# Patient Record
Sex: Female | Born: 1965
Health system: Southern US, Community
[De-identification: ages and names within clinical notes are randomized; demographics above are authoritative.]

## PROBLEM LIST (undated history)

## (undated) DIAGNOSIS — T4145XA Adverse effect of unspecified anesthetic, initial encounter: Secondary | ICD-10-CM

## (undated) DIAGNOSIS — L719 Rosacea, unspecified: Secondary | ICD-10-CM

## (undated) DIAGNOSIS — C801 Malignant (primary) neoplasm, unspecified: Secondary | ICD-10-CM

## (undated) DIAGNOSIS — T8859XA Other complications of anesthesia, initial encounter: Secondary | ICD-10-CM

## (undated) DIAGNOSIS — R232 Flushing: Secondary | ICD-10-CM

## (undated) DIAGNOSIS — T451X5A Adverse effect of antineoplastic and immunosuppressive drugs, initial encounter: Secondary | ICD-10-CM

## (undated) DIAGNOSIS — J329 Chronic sinusitis, unspecified: Secondary | ICD-10-CM

## (undated) DIAGNOSIS — H04123 Dry eye syndrome of bilateral lacrimal glands: Secondary | ICD-10-CM

## (undated) DIAGNOSIS — R51 Headache: Secondary | ICD-10-CM

## (undated) DIAGNOSIS — E039 Hypothyroidism, unspecified: Secondary | ICD-10-CM

## (undated) DIAGNOSIS — Z789 Other specified health status: Secondary | ICD-10-CM

## (undated) DIAGNOSIS — F32A Depression, unspecified: Secondary | ICD-10-CM

## (undated) DIAGNOSIS — F329 Major depressive disorder, single episode, unspecified: Secondary | ICD-10-CM

## (undated) HISTORY — DX: Major depressive disorder, single episode, unspecified: F32.9

## (undated) HISTORY — PX: HAND SURGERY: SHX662

## (undated) HISTORY — PX: BREAST SURGERY: SHX581

## (undated) HISTORY — PX: LASER ABLATION: SHX1947

## (undated) HISTORY — DX: Dry eye syndrome of bilateral lacrimal glands: H04.123

## (undated) HISTORY — DX: Flushing: R23.2

## (undated) HISTORY — PX: OTHER SURGICAL HISTORY: SHX169

## (undated) HISTORY — DX: Rosacea, unspecified: L71.9

## (undated) HISTORY — PX: TONSILLECTOMY: SUR1361

## (undated) HISTORY — PX: FUNCTIONAL ENDOSCOPIC SINUS SURGERY: SUR616

## (undated) HISTORY — DX: Adverse effect of antineoplastic and immunosuppressive drugs, initial encounter: T45.1X5A

## (undated) HISTORY — DX: Depression, unspecified: F32.A

---

## 2001-08-21 ENCOUNTER — Encounter: Admission: RE | Admit: 2001-08-21 | Discharge: 2001-08-21 | Payer: Self-pay | Admitting: Internal Medicine

## 2002-08-02 ENCOUNTER — Encounter: Admission: RE | Admit: 2002-08-02 | Discharge: 2002-08-02 | Payer: Self-pay | Admitting: Internal Medicine

## 2002-12-16 ENCOUNTER — Encounter: Admission: RE | Admit: 2002-12-16 | Discharge: 2002-12-16 | Payer: Self-pay | Admitting: Internal Medicine

## 2003-02-01 ENCOUNTER — Encounter: Admission: RE | Admit: 2003-02-01 | Discharge: 2003-02-01 | Payer: Self-pay | Admitting: Internal Medicine

## 2003-04-05 ENCOUNTER — Encounter: Admission: RE | Admit: 2003-04-05 | Discharge: 2003-04-05 | Payer: Self-pay | Admitting: Internal Medicine

## 2003-12-27 ENCOUNTER — Encounter: Admission: RE | Admit: 2003-12-27 | Discharge: 2003-12-27 | Payer: Self-pay | Admitting: Internal Medicine

## 2004-08-16 ENCOUNTER — Ambulatory Visit: Payer: Self-pay | Admitting: Internal Medicine

## 2004-10-17 ENCOUNTER — Ambulatory Visit (HOSPITAL_COMMUNITY): Admission: RE | Admit: 2004-10-17 | Discharge: 2004-10-17 | Payer: Self-pay | Admitting: Allergy

## 2004-11-29 ENCOUNTER — Ambulatory Visit: Payer: Self-pay | Admitting: Internal Medicine

## 2005-02-21 ENCOUNTER — Ambulatory Visit: Payer: Self-pay | Admitting: Internal Medicine

## 2005-02-21 ENCOUNTER — Ambulatory Visit (HOSPITAL_COMMUNITY): Admission: RE | Admit: 2005-02-21 | Discharge: 2005-02-21 | Payer: Self-pay | Admitting: Internal Medicine

## 2005-02-27 ENCOUNTER — Ambulatory Visit (HOSPITAL_COMMUNITY): Admission: RE | Admit: 2005-02-27 | Discharge: 2005-02-27 | Payer: Self-pay | Admitting: Internal Medicine

## 2005-03-15 ENCOUNTER — Ambulatory Visit: Payer: Self-pay

## 2005-03-22 ENCOUNTER — Ambulatory Visit (HOSPITAL_COMMUNITY): Admission: RE | Admit: 2005-03-22 | Discharge: 2005-03-22 | Payer: Self-pay | Admitting: Internal Medicine

## 2005-04-15 ENCOUNTER — Ambulatory Visit: Payer: Self-pay | Admitting: Pulmonary Disease

## 2005-05-01 ENCOUNTER — Ambulatory Visit: Payer: Self-pay | Admitting: Pulmonary Disease

## 2005-05-02 ENCOUNTER — Ambulatory Visit: Payer: Self-pay | Admitting: Internal Medicine

## 2005-05-10 ENCOUNTER — Other Ambulatory Visit: Admission: RE | Admit: 2005-05-10 | Discharge: 2005-05-10 | Payer: Self-pay | Admitting: Obstetrics and Gynecology

## 2005-06-12 ENCOUNTER — Encounter: Admission: RE | Admit: 2005-06-12 | Discharge: 2005-06-12 | Payer: Self-pay | Admitting: Otolaryngology

## 2005-10-07 ENCOUNTER — Encounter (INDEPENDENT_AMBULATORY_CARE_PROVIDER_SITE_OTHER): Payer: Self-pay | Admitting: *Deleted

## 2005-10-07 ENCOUNTER — Ambulatory Visit (HOSPITAL_BASED_OUTPATIENT_CLINIC_OR_DEPARTMENT_OTHER): Admission: RE | Admit: 2005-10-07 | Discharge: 2005-10-07 | Payer: Self-pay | Admitting: Otolaryngology

## 2005-11-26 ENCOUNTER — Ambulatory Visit: Payer: Self-pay | Admitting: Internal Medicine

## 2006-03-13 ENCOUNTER — Observation Stay (HOSPITAL_COMMUNITY): Admission: EM | Admit: 2006-03-13 | Discharge: 2006-03-14 | Payer: Self-pay | Admitting: Emergency Medicine

## 2006-10-24 ENCOUNTER — Ambulatory Visit: Payer: Self-pay | Admitting: Internal Medicine

## 2006-10-24 ENCOUNTER — Encounter: Payer: Self-pay | Admitting: Infectious Diseases

## 2006-10-24 ENCOUNTER — Ambulatory Visit (HOSPITAL_COMMUNITY): Admission: RE | Admit: 2006-10-24 | Discharge: 2006-10-25 | Payer: Self-pay | Admitting: Orthopaedic Surgery

## 2006-10-24 DIAGNOSIS — L03019 Cellulitis of unspecified finger: Secondary | ICD-10-CM

## 2006-10-24 DIAGNOSIS — L02519 Cutaneous abscess of unspecified hand: Secondary | ICD-10-CM | POA: Insufficient documentation

## 2007-04-01 ENCOUNTER — Encounter (INDEPENDENT_AMBULATORY_CARE_PROVIDER_SITE_OTHER): Payer: Self-pay | Admitting: Obstetrics and Gynecology

## 2007-04-01 ENCOUNTER — Ambulatory Visit (HOSPITAL_COMMUNITY): Admission: RE | Admit: 2007-04-01 | Discharge: 2007-04-01 | Payer: Self-pay | Admitting: Obstetrics and Gynecology

## 2008-05-13 ENCOUNTER — Encounter (INDEPENDENT_AMBULATORY_CARE_PROVIDER_SITE_OTHER): Payer: Self-pay | Admitting: Internal Medicine

## 2008-05-13 ENCOUNTER — Ambulatory Visit (HOSPITAL_COMMUNITY): Admission: RE | Admit: 2008-05-13 | Discharge: 2008-05-13 | Payer: Self-pay | Admitting: Internal Medicine

## 2008-05-13 DIAGNOSIS — M79609 Pain in unspecified limb: Secondary | ICD-10-CM | POA: Insufficient documentation

## 2008-08-19 HISTORY — PX: BREAST LUMPECTOMY: SHX2

## 2008-08-19 HISTORY — PX: BREAST BIOPSY: SHX20

## 2008-10-17 ENCOUNTER — Encounter: Admission: RE | Admit: 2008-10-17 | Discharge: 2008-10-17 | Payer: Self-pay | Admitting: Obstetrics and Gynecology

## 2008-10-17 ENCOUNTER — Encounter (INDEPENDENT_AMBULATORY_CARE_PROVIDER_SITE_OTHER): Payer: Self-pay | Admitting: Obstetrics and Gynecology

## 2008-10-17 ENCOUNTER — Encounter (INDEPENDENT_AMBULATORY_CARE_PROVIDER_SITE_OTHER): Payer: Self-pay | Admitting: Diagnostic Radiology

## 2008-10-19 ENCOUNTER — Ambulatory Visit: Payer: Self-pay | Admitting: Oncology

## 2008-10-24 ENCOUNTER — Encounter: Admission: RE | Admit: 2008-10-24 | Discharge: 2008-10-24 | Payer: Self-pay | Admitting: Surgery

## 2008-10-24 ENCOUNTER — Encounter (INDEPENDENT_AMBULATORY_CARE_PROVIDER_SITE_OTHER): Payer: Self-pay | Admitting: Surgery

## 2008-10-24 ENCOUNTER — Encounter: Admission: RE | Admit: 2008-10-24 | Discharge: 2008-10-24 | Payer: Self-pay | Admitting: Obstetrics and Gynecology

## 2008-10-25 ENCOUNTER — Ambulatory Visit: Payer: Self-pay | Admitting: Genetic Counselor

## 2008-11-02 ENCOUNTER — Ambulatory Visit: Admission: RE | Admit: 2008-11-02 | Discharge: 2008-12-16 | Payer: Self-pay | Admitting: Radiation Oncology

## 2008-11-08 ENCOUNTER — Ambulatory Visit (HOSPITAL_COMMUNITY): Admission: RE | Admit: 2008-11-08 | Discharge: 2008-11-08 | Payer: Self-pay | Admitting: Surgery

## 2008-11-09 ENCOUNTER — Encounter: Admission: RE | Admit: 2008-11-09 | Discharge: 2008-11-09 | Payer: Self-pay | Admitting: Surgery

## 2008-11-09 ENCOUNTER — Ambulatory Visit (HOSPITAL_BASED_OUTPATIENT_CLINIC_OR_DEPARTMENT_OTHER): Admission: RE | Admit: 2008-11-09 | Discharge: 2008-11-09 | Payer: Self-pay | Admitting: Surgery

## 2008-11-09 ENCOUNTER — Encounter (INDEPENDENT_AMBULATORY_CARE_PROVIDER_SITE_OTHER): Payer: Self-pay | Admitting: Surgery

## 2008-12-14 ENCOUNTER — Ambulatory Visit: Payer: Self-pay | Admitting: Psychiatry

## 2008-12-19 ENCOUNTER — Ambulatory Visit: Payer: Self-pay | Admitting: Psychiatry

## 2008-12-19 ENCOUNTER — Ambulatory Visit: Admission: RE | Admit: 2008-12-19 | Discharge: 2009-02-12 | Payer: Self-pay | Admitting: Radiation Oncology

## 2008-12-28 ENCOUNTER — Ambulatory Visit: Payer: Self-pay | Admitting: Psychiatry

## 2009-01-10 ENCOUNTER — Ambulatory Visit: Payer: Self-pay | Admitting: Oncology

## 2009-01-18 ENCOUNTER — Ambulatory Visit: Payer: Self-pay | Admitting: Psychiatry

## 2009-03-27 ENCOUNTER — Telehealth: Payer: Self-pay | Admitting: Internal Medicine

## 2009-04-07 DIAGNOSIS — F3289 Other specified depressive episodes: Secondary | ICD-10-CM | POA: Insufficient documentation

## 2009-04-07 DIAGNOSIS — J309 Allergic rhinitis, unspecified: Secondary | ICD-10-CM | POA: Insufficient documentation

## 2009-04-07 DIAGNOSIS — F329 Major depressive disorder, single episode, unspecified: Secondary | ICD-10-CM | POA: Insufficient documentation

## 2009-04-07 DIAGNOSIS — K5900 Constipation, unspecified: Secondary | ICD-10-CM | POA: Insufficient documentation

## 2009-04-07 DIAGNOSIS — K649 Unspecified hemorrhoids: Secondary | ICD-10-CM | POA: Insufficient documentation

## 2009-04-07 DIAGNOSIS — K581 Irritable bowel syndrome with constipation: Secondary | ICD-10-CM | POA: Insufficient documentation

## 2009-04-13 ENCOUNTER — Ambulatory Visit: Payer: Self-pay | Admitting: Internal Medicine

## 2009-04-13 DIAGNOSIS — Z853 Personal history of malignant neoplasm of breast: Secondary | ICD-10-CM | POA: Insufficient documentation

## 2009-04-13 DIAGNOSIS — J45990 Exercise induced bronchospasm: Secondary | ICD-10-CM | POA: Insufficient documentation

## 2009-04-13 DIAGNOSIS — D649 Anemia, unspecified: Secondary | ICD-10-CM | POA: Insufficient documentation

## 2009-04-13 DIAGNOSIS — E039 Hypothyroidism, unspecified: Secondary | ICD-10-CM | POA: Insufficient documentation

## 2009-04-14 ENCOUNTER — Encounter: Admission: RE | Admit: 2009-04-14 | Discharge: 2009-04-14 | Payer: Self-pay | Admitting: Radiation Oncology

## 2009-07-06 ENCOUNTER — Ambulatory Visit: Payer: Self-pay | Admitting: Oncology

## 2009-07-11 LAB — CBC WITH DIFFERENTIAL/PLATELET
BASO%: 0.5 % (ref 0.0–2.0)
Basophils Absolute: 0 10*3/uL (ref 0.0–0.1)
EOS%: 1.5 % (ref 0.0–7.0)
Eosinophils Absolute: 0.1 10*3/uL (ref 0.0–0.5)
HCT: 38.7 % (ref 34.8–46.6)
HGB: 13.2 g/dL (ref 11.6–15.9)
LYMPH%: 22.8 % (ref 14.0–49.7)
MCH: 33.1 pg (ref 25.1–34.0)
MCHC: 34 g/dL (ref 31.5–36.0)
MCV: 97.3 fL (ref 79.5–101.0)
MONO#: 0.6 10*3/uL (ref 0.1–0.9)
MONO%: 8.5 % (ref 0.0–14.0)
NEUT#: 4.5 10*3/uL (ref 1.5–6.5)
NEUT%: 66.7 % (ref 38.4–76.8)
Platelets: 222 10*3/uL (ref 145–400)
RBC: 3.98 10*6/uL (ref 3.70–5.45)
RDW: 13.1 % (ref 11.2–14.5)
WBC: 6.8 10*3/uL (ref 3.9–10.3)
lymph#: 1.5 10*3/uL (ref 0.9–3.3)

## 2009-07-11 LAB — COMPREHENSIVE METABOLIC PANEL
ALT: 13 U/L (ref 0–35)
AST: 17 U/L (ref 0–37)
Albumin: 4.2 g/dL (ref 3.5–5.2)
Alkaline Phosphatase: 22 U/L — ABNORMAL LOW (ref 39–117)
BUN: 16 mg/dL (ref 6–23)
CO2: 23 mEq/L (ref 19–32)
Calcium: 9.2 mg/dL (ref 8.4–10.5)
Chloride: 107 mEq/L (ref 96–112)
Creatinine, Ser: 1.06 mg/dL (ref 0.40–1.20)
Glucose, Bld: 100 mg/dL — ABNORMAL HIGH (ref 70–99)
Potassium: 4.2 mEq/L (ref 3.5–5.3)
Sodium: 142 mEq/L (ref 135–145)
Total Bilirubin: 0.4 mg/dL (ref 0.3–1.2)
Total Protein: 6.7 g/dL (ref 6.0–8.3)

## 2009-07-11 LAB — LACTATE DEHYDROGENASE: LDH: 145 U/L (ref 94–250)

## 2009-10-19 ENCOUNTER — Encounter: Admission: RE | Admit: 2009-10-19 | Discharge: 2009-10-19 | Payer: Self-pay | Admitting: Oncology

## 2010-07-05 ENCOUNTER — Ambulatory Visit: Payer: Self-pay | Admitting: Oncology

## 2010-07-16 LAB — COMPREHENSIVE METABOLIC PANEL
CO2: 26 mEq/L (ref 19–32)
Calcium: 9.1 mg/dL (ref 8.4–10.5)
Chloride: 108 mEq/L (ref 96–112)
Creatinine, Ser: 1.26 mg/dL — ABNORMAL HIGH (ref 0.40–1.20)
Potassium: 3.9 mEq/L (ref 3.5–5.3)
Total Bilirubin: 0.4 mg/dL (ref 0.3–1.2)
Total Protein: 6.7 g/dL (ref 6.0–8.3)

## 2010-07-16 LAB — CBC WITH DIFFERENTIAL/PLATELET
EOS%: 1.2 % (ref 0.0–7.0)
MONO#: 0.5 10*3/uL (ref 0.1–0.9)
MONO%: 6.7 % (ref 0.0–14.0)
NEUT#: 5.4 10*3/uL (ref 1.5–6.5)
RBC: 3.72 10*6/uL (ref 3.70–5.45)
RDW: 12.6 % (ref 11.2–14.5)
WBC: 7.7 10*3/uL (ref 3.9–10.3)

## 2010-09-08 ENCOUNTER — Other Ambulatory Visit: Payer: Self-pay | Admitting: Oncology

## 2010-09-08 DIAGNOSIS — Z9889 Other specified postprocedural states: Secondary | ICD-10-CM

## 2010-09-26 ENCOUNTER — Other Ambulatory Visit: Payer: Self-pay | Admitting: Internal Medicine

## 2010-09-26 ENCOUNTER — Telehealth: Payer: Self-pay | Admitting: Internal Medicine

## 2010-09-26 DIAGNOSIS — R1013 Epigastric pain: Secondary | ICD-10-CM | POA: Insufficient documentation

## 2010-09-28 ENCOUNTER — Ambulatory Visit (HOSPITAL_COMMUNITY)
Admission: RE | Admit: 2010-09-28 | Discharge: 2010-09-28 | Disposition: A | Discharge status: Home / Self Care | Payer: Commercial Managed Care - PPO | Source: Ambulatory Visit | Attending: Internal Medicine | Admitting: Internal Medicine

## 2010-09-28 DIAGNOSIS — K219 Gastro-esophageal reflux disease without esophagitis: Secondary | ICD-10-CM | POA: Insufficient documentation

## 2010-09-28 DIAGNOSIS — R109 Unspecified abdominal pain: Secondary | ICD-10-CM | POA: Insufficient documentation

## 2010-09-28 DIAGNOSIS — Z853 Personal history of malignant neoplasm of breast: Secondary | ICD-10-CM | POA: Insufficient documentation

## 2010-10-04 NOTE — Progress Notes (Signed)
Summary: Triage  Phone Note Call from Patient Call back at Home Phone [phone]   Caller: Patient Call For: Dr. Juanda Chance Reason for Call: Talk to Nurse Summary of Call: Pt is calling because she thinks she is having a gallbladder attack, spoke with brodie at the hospital and would like to proceed with UAS Initial call taken by: [place] Johnson,  September 26, 2010 9:42 AM  Follow-up for Phone Call        Spoke with patient. She thought she had Norovirus about 3 weeks ago. Also took antibiotics for a sinus infection. She had her stool tested for c.diff by PCP which was negative. PCP had put her on Flagyl but she stopped when her stool was negative. Last Wednesday, ate dinner then had violent vomiting and ran fever for 2 days, no diarrhea. Now she has light colored stools. Stomach has a sense of being "unsettled." Has bloating and belching. Denies abdominal pain or tenderness. Thinks this may be her gallbladder and would like an UAS. Please, advise. 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 Additional Follow-up by: Hart Carwin MD,  September 26, 2010 12:42 PM  New Problems: ABDOMINAL PAIN, EPIGASTRIC (ICD-789.06)   Additional Follow-up for Phone Call Additional follow up Details #2::    Notified patient U/S ordered for 09/28/10 at 0845 at [facility] per Dr Juanda Chance; NPO after midnight. Patient stated understanding. Follow-up by: Graciella Freer RN,  September 26, 2010 3:27 PM  New Problems: ABDOMINAL PAIN, EPIGASTRIC (ICD-789.06)

## 2010-10-22 ENCOUNTER — Ambulatory Visit
Admission: RE | Admit: 2010-10-22 | Discharge: 2010-10-22 | Disposition: A | Discharge status: Home / Self Care | Payer: Commercial Managed Care - PPO | Source: Ambulatory Visit | Attending: Oncology | Admitting: Oncology

## 2010-10-22 DIAGNOSIS — Z9889 Other specified postprocedural states: Secondary | ICD-10-CM

## 2010-11-09 LAB — HM PAP SMEAR: HM Pap smear: NORMAL

## 2010-11-18 LAB — HM PAP SMEAR

## 2010-11-29 LAB — URINALYSIS, ROUTINE W REFLEX MICROSCOPIC
Bilirubin Urine: NEGATIVE
Glucose, UA: NEGATIVE mg/dL
Hgb urine dipstick: NEGATIVE
Ketones, ur: NEGATIVE mg/dL
Nitrite: NEGATIVE
Protein, ur: NEGATIVE mg/dL
Specific Gravity, Urine: 1.023 (ref 1.005–1.030)
Urobilinogen, UA: 1 mg/dL (ref 0.0–1.0)
pH: 6 (ref 5.0–8.0)

## 2010-11-29 LAB — COMPREHENSIVE METABOLIC PANEL
ALT: 15 U/L (ref 0–35)
AST: 21 U/L (ref 0–37)
Albumin: 3.5 g/dL (ref 3.5–5.2)
Alkaline Phosphatase: 24 U/L — ABNORMAL LOW (ref 39–117)
BUN: 11 mg/dL (ref 6–23)
CO2: 24 mEq/L (ref 19–32)
Calcium: 9 mg/dL (ref 8.4–10.5)
Chloride: 109 mEq/L (ref 96–112)
Creatinine, Ser: 0.94 mg/dL (ref 0.4–1.2)
GFR calc Af Amer: >60 mL/min (ref >60)
GFR calc non Af Amer: >60 mL/min (ref >60)
Glucose, Bld: 165 mg/dL — ABNORMAL HIGH (ref 70–99)
Potassium: 4 mEq/L (ref 3.5–5.1)
Sodium: 138 mEq/L (ref 135–145)
Total Bilirubin: 0.6 mg/dL (ref 0.3–1.2)
Total Protein: 6.4 g/dL (ref 6.0–8.3)

## 2010-11-29 LAB — DIFFERENTIAL
Basophils Absolute: 0 10*3/uL (ref 0.0–0.1)
Basophils Relative: 0 % (ref 0–1)
Eosinophils Absolute: 0 10*3/uL (ref 0.0–0.7)
Eosinophils Relative: 0 % (ref 0–5)
Lymphocytes Relative: 23 % (ref 12–46)
Lymphs Abs: 1.9 10*3/uL (ref 0.7–4.0)
Monocytes Absolute: 0.2 10*3/uL (ref 0.1–1.0)
Monocytes Relative: 3 % (ref 3–12)
Neutro Abs: 5.8 10*3/uL (ref 1.7–7.7)
Neutrophils Relative %: 73 % (ref 43–77)

## 2010-11-29 LAB — CBC
HCT: 35.9 % — ABNORMAL LOW (ref 36.0–46.0)
Hemoglobin: 12.4 g/dL (ref 12.0–15.0)
MCHC: 34.6 g/dL (ref 30.0–36.0)
MCV: 94.9 fL (ref 78.0–100.0)
Platelets: 229 10*3/uL (ref 150–400)
RBC: 3.78 MIL/uL — ABNORMAL LOW (ref 3.87–5.11)
RDW: 12.4 % (ref 11.5–15.5)
WBC: 8 10*3/uL (ref 4.0–10.5)

## 2011-01-01 NOTE — Op Note (Signed)
NAMECLEA, DUBACH              ACCOUNT NO.:  [id number]   MEDICAL RECORD NO.:  [id number]          PATIENT TYPE:  AMB   LOCATION:                               FACILITY:  [facility]   PHYSICIAN:  Currie Paris, M.D.DATE OF BIRTH:  [birth year]   DATE OF PROCEDURE:  11/09/2008  DATE OF DISCHARGE:                               OPERATIVE REPORT   PREOPERATIVE DIAGNOSIS:  Ductal carcinoma in situ, left breast, lower  outer quadrant.   POSTOPERATIVE DIAGNOSIS:  Ductal carcinoma in situ, left breast, lower  outer quadrant.   OPERATION:  Needle-guided excision, left breast cancer.   SURGEON:  Currie Paris, MD   ANESTHESIA:  General.   CLINICAL HISTORY:  This is a 45 year old lady with a recent diagnosis of  an area of DCIS in the left breast laterally, but I think just into the  inferior quadrant.  After appropriate evaluation and workup, she elected  to proceed with needle-guided excisional excision of her cancer.   DESCRIPTION OF PROCEDURE:  The patient was seen in the holding area and  she had no further questions.  We both identified and marked the left  breast as the operative side.  I reviewed the guidewire placement films  and the guidewire appeared to go just adjacent to the clip.   The patient was taken to the operating room.  After a satisfactory  general anesthesia had been obtained, the time-out was done.  The breast  was then prepped and draped.  The guidewire entered laterally and  tracked medially towards the areola and somewhat inferiorly.  I made a  transversely oriented incision over what I thought was the tract of the  guidewire starting at the areolar margin and going just lateral to the  guidewire entry site.  I raised some skin flap superiorly, inferiorly,  and medially.  Then with traction on the guidewire, I was able to take a  large area of tissue around the guidewire going down to the chest wall  and trying to stay around it in all directions.   There was a little  ecchymosis along the inferior edge.  I took a little bit more tissue  there to be sure I was well around that area.  I thought that might have  represented the prior biopsy or perhaps from the guidewire placement  today.  However, when I divided the tissue at the very medial edge, I  was beyond the tip of the guidewire.   I put some local in to help with postop analgesia.  I mobilized the  breast a little bit off the chest wall and then closed that somewhat,  then closed the subcutaneous tissues and skin.  Dermabond was applied.   The specimen mammogram showed the clip appeared to be in the center of  the specimen.   The patient tolerated the procedure well.  Sterile dressings were  applied with an Ace wrap.  There were no operative complications.  All  counts were correct.      Currie Paris, M.D.  Electronically Signed     CJS/MEDQ  D:  11/09/2008  T:  11/10/2008  Job:  [id number]   cc:   Carrington Clamp, M.D.  Genene Churn. Cyndie Chime, M.D.

## 2011-01-01 NOTE — Op Note (Signed)
NAMEGIAH, Amanda Jensen              ACCOUNT NO.:  [id number]   MEDICAL RECORD NO.:  [id number]          PATIENT TYPE:  AMB   LOCATION:  SDC                           FACILITY:  [facility]   PHYSICIAN:  Carrington Clamp, M.D. DATE OF BIRTH:  [birth year]   DATE OF PROCEDURE:  04/01/2007  DATE OF DISCHARGE:                               OPERATIVE REPORT   PREOPERATIVE DIAGNOSIS:  Menometrorrhagia.   POSTOPERATIVE DIAGNOSIS:  Menometrorrhagia, fibroids.   OPERATION PERFORMED:  Dilation and curettage, hysterectomy and Novasure  ablation.   SURGEON:  Carrington Clamp, M.D.   ASSISTANT:  None.   ANESTHESIA:  LMA.   FINDINGS:  Essentially normal intrauterine cavity except a bulge near  the top of the fundus that appeared to be a submucosal fibroid.  The  ostia were seen bilaterally, minimal tissue was obtained on dilation and  curettage.   SPECIMENS:  Uterine curettings to pathology.   FINDINGS:  A 6 x 3.5 to 4 cm cavity.  As noted above, there was a small  submucosal fibroid.  Two ablations were actually undertaken because  after the first ablation the instrument had not deployed entirely and it  was suspected that the instrument was defected because it happened  before the instrument even went into the cavity.  The top of the fundus  appeared to be spared from the ablation.  A second procedure with a new  Novasure instrument was then performed.  Although better contact was  obtained, it is possible that the submucosal fibroid kept the instrument  from being able to be seated all the way at the top of the fundus and  there was approximately 3 to 4 cm strip at the top of the fundus that  did not appear to be cauterized.   ESTIMATED BLOOD LOSS:  Minimal.   URINE OUTPUT:  Not measured.   IV FLUIDS:  1200 mL.   COMPLICATIONS:  None.   COUNTS:  Correct x3.   DESCRIPTION OF PROCEDURE:  After adequate general LMA anesthesia was  achieved.  The patient was prepped and draped in the usual  sterile  fashion dorsal lithotomy position.  Bladder was emptied with a red  rubber catheter and the speculum placed in the vagina.  The cervix was  secured with a single toothed tenaculum and the cervix was carefully  dilated up with Shawnie Pons dilators.  The cervix had been measured before  this and came with a measurement of 3 cm.  The uterine sound length was  9 cm giving a cavity length of 6 cm.  The hysteroscope was passed into  the uterine cavity and the above findings noted.  A sharp curettage was  then performed and tissue sent to pathology.  The Novasure ablation was  set and was introduced into the uterine cavity without any complication.  It appeared to seat well although on the first deployment outside the  body it seemed to have gotten a little stuck.  The CO2 cavity assessment  was passed without complication and the first ablation proceeded at 116  W for 90 seconds.  The Novasure instrument was then  removed from the  cavity and the hysteroscope passed in again.  Again it was noted that  the top of the fundus was completely spared from the cautery and it was  suspected that it was the instrument that had been faulty since when it  had been deployed outside of the body before the procedure, it seemed to  have gotten stuck.  A new Novasure instrument was then obtained and  reintroduced with a cavity width of 4 cm.  The instrument appeared to be  seated correctly and well and the CO2 test was again passed without  problem.  The second Novasure ablation was then carried out at a power  of 132 for 52 seconds and then Novasure instrument removed carefully.  Again, there was a small, this time approximately 4 x 1 cm strip at the  top of the fundus that appeared to be spared.  It is possible that the  submucosal fibroid was preventing the instrument from being seated  entirely.  There was also a lot of contraction of the uterus that was  making it difficult for the cavity to be distended  appropriately.  All  instruments were then withdrawn from the uterus.  The tenaculum was  removed from the cervix and the site was hemostatic.  The tenaculum had  been pulled off a couple of times, but there was no bleeding from this  area postoperatively.  The patient tolerated the procedure well, was  returned to recovery room in stable condition.      Carrington Clamp, M.D.  Electronically Signed     MH/MEDQ  D:  04/01/2007  T:  04/02/2007  Job:  [id number]

## 2011-01-30 ENCOUNTER — Ambulatory Visit
Admission: RE | Admit: 2011-01-30 | Discharge: 2011-01-30 | Disposition: A | Discharge status: Home / Self Care | Payer: 59 | Source: Ambulatory Visit | Attending: Radiation Oncology | Admitting: Radiation Oncology

## 2011-02-21 ENCOUNTER — Encounter (HOSPITAL_COMMUNITY)
Admission: RE | Admit: 2011-02-21 | Discharge: 2011-02-21 | Disposition: A | Discharge status: Home / Self Care | Payer: 59 | Source: Ambulatory Visit | Attending: Obstetrics and Gynecology | Admitting: Obstetrics and Gynecology

## 2011-02-21 ENCOUNTER — Encounter (HOSPITAL_COMMUNITY): Payer: Self-pay

## 2011-02-21 HISTORY — DX: Other complications of anesthesia, initial encounter: T88.59XA

## 2011-02-21 HISTORY — DX: Headache: R51

## 2011-02-21 HISTORY — DX: Malignant (primary) neoplasm, unspecified: C80.1

## 2011-02-21 HISTORY — DX: Chronic sinusitis, unspecified: J32.9

## 2011-02-21 HISTORY — DX: Adverse effect of unspecified anesthetic, initial encounter: T41.45XA

## 2011-02-21 HISTORY — DX: Other specified health status: Z78.9

## 2011-02-21 HISTORY — DX: Hypothyroidism, unspecified: E03.9

## 2011-02-21 LAB — CBC
MCH: 31.8 pg (ref 26.0–34.0)
MCV: 93 fL (ref 78.0–100.0)
Platelets: 195 10*3/uL (ref 150–400)
RDW: 12.3 % (ref 11.5–15.5)
WBC: 7.6 10*3/uL (ref 4.0–10.5)

## 2011-02-21 LAB — SURGICAL PCR SCREEN: MRSA, PCR: POSITIVE — AB

## 2011-02-21 LAB — FOLLICLE STIMULATING HORMONE: FSH: 7.8 m[IU]/mL

## 2011-02-21 NOTE — Patient Instructions (Signed)
Written instructions given to pt-pt had visit with Sheral Apley during PAT

## 2011-02-23 ENCOUNTER — Ambulatory Visit (HOSPITAL_COMMUNITY)
Admission: RE | Admit: 2011-02-23 | Discharge: 2011-02-23 | Disposition: A | Discharge status: Home / Self Care | Payer: 59 | Source: Ambulatory Visit | Attending: Internal Medicine | Admitting: Internal Medicine

## 2011-02-23 ENCOUNTER — Other Ambulatory Visit (HOSPITAL_COMMUNITY): Payer: Self-pay | Admitting: Internal Medicine

## 2011-02-23 DIAGNOSIS — IMO0002 Reserved for concepts with insufficient information to code with codable children: Secondary | ICD-10-CM

## 2011-02-23 DIAGNOSIS — M25519 Pain in unspecified shoulder: Secondary | ICD-10-CM | POA: Insufficient documentation

## 2011-02-23 DIAGNOSIS — M259 Joint disorder, unspecified: Secondary | ICD-10-CM | POA: Insufficient documentation

## 2011-02-23 DIAGNOSIS — Z01812 Encounter for preprocedural laboratory examination: Secondary | ICD-10-CM | POA: Insufficient documentation

## 2011-02-26 MED ORDER — CLINDAMYCIN PHOSPHATE 900 MG/50ML IV SOLN
900.0000 mg | INTRAVENOUS | Status: DC
  Filled 2011-02-26: qty 50

## 2011-02-26 MED ORDER — CIPROFLOXACIN IN D5W 400 MG/200ML IV SOLN
400.0000 mg | INTRAVENOUS | Status: DC
  Filled 2011-02-26: qty 200

## 2011-02-26 NOTE — H&P (Signed)
Amanda Jensen is an 45 y.o. female who is nulliparous.  She has undergone a Novasure ablation in the last two years for menorrhagia but since has become symptomatic from her fibroids.  She now complains of increased pressure and discomfort from frequency and urgency of urination.  She is also on tamoxifen for breast cancer.  An ultrasound showed an increase in size of her fibroids over the last 5 years and she desires definitive therapy.  Pertinent Gynecological History: Menses: no bleeding for 1 yeat Bleeding: none Contraception: none DES exposure: denies Blood transfusions: none Sexually transmitted diseases: no past history Previous GYN Procedures: novasure  Last mammogram: normal Date:   Last pap: normal Date:   OB History: G0, P0    Menstrual History: Menarche age:  No LMP recorded. Patient has had an ablation.    Past Medical History  Diagnosis Date  . Complication of anesthesia     succinyl choline reacttion-intense muscle a ches  . Cancer     history of breast cancer-on tamoxifen  . Hypothyroidism     synthroid  . Headache     occasional migraine  . Sinus infection     recent sinus infection -on clindamycin 150mg  q6 hr  . Allergy history unknown     seasonal-on zyrtec    Past Surgical History  Procedure Date  . Breast surgery     lumpectomy-2010  . Functional endoscopic sinus surgery     repair deviated septum-207  . Laser ablation     2008  . Tonsillectomy     age 38    No family history on file.  Social History:  reports that she has never smoked. She does not have any smokeless tobacco history on file. She reports that she drinks alcohol. She reports that she does not use illicit drugs.  Allergies:  Allergies  Allergen Reactions  . Erythromycin     REACTION: GI Intolerance  . Penicillins     REACTION: steven's johnson syndrome  . Sulfonamide Derivatives     REACTION: rash    No prescriptions prior to admission    Review of Systems    Constitutional: Negative.   HENT: Positive for congestion and sore throat.   Skin: Negative.     There were no vitals taken for this visit. Physical Exam  Constitutional: She is oriented to person, place, and time. She appears well-developed.  HENT:  Head: Normocephalic.  Eyes: Pupils are equal, round, and reactive to light.  Neck: Normal range of motion. Neck supple.  Cardiovascular: Normal rate, regular rhythm, normal heart sounds and intact distal pulses.   Respiratory: Effort normal and breath sounds normal.  GI: Soft. Bowel sounds are normal.  Genitourinary: Rectum normal. Uterus is enlarged.       Uterus is 12 weeks size, mid and mobile, not fixed.  No other masses are felt.    Musculoskeletal: Normal range of motion.  Neurological: She is alert and oriented to person, place, and time.  Skin: Skin is warm and dry.  Psychiatric: She has a normal mood and affect. Her behavior is normal.    No results found for this or any previous visit (from the past 24 hour(s)).  U/S in office uterus 10x8.8x7.65.  Eight fibroids, biggest 6 cm.  EM 6.6 mm.  Ovaries normal.  EMB in office endocervical glands only. No endometrium.  No results found.  Assessment/Plan: After careful discussion with patient about symptoms of fibroids, risks of enlarging fibroids, risks of endometrial cancer and  inability of being able to sample endometrium, patient desires definitive therapy for fibroid uterus.  Patient strongly desires to retain cervix for specific reasons and understands that pathology will receive a morcellated specimen for diagnosis of endometrial problems.  After multiple discussions with her oncologists, she desires to retain her ovaries if normal since she is BrCA 1&2 negative.  All risks, benefits and alternatives d/w patient and she desires to proceed with a Robotic Laparoscopic Supracervical hysterectomy with bilateral salpingectomies.  Patient understands port placement.  Patient has  undergone a modified bowel prep and will receive preop antibiotics and SCDs during the operation.    Emannuel Vise A 02/26/2011, 8:04 AM

## 2011-02-27 ENCOUNTER — Encounter (HOSPITAL_COMMUNITY)
Admission: AD | Disposition: A | Discharge status: Home / Self Care | Payer: Self-pay | Source: Ambulatory Visit | Attending: Obstetrics and Gynecology

## 2011-02-27 ENCOUNTER — Encounter (HOSPITAL_COMMUNITY): Payer: Self-pay | Admitting: Anesthesiology

## 2011-02-27 ENCOUNTER — Encounter (HOSPITAL_COMMUNITY): Payer: Self-pay | Admitting: *Deleted

## 2011-02-27 ENCOUNTER — Ambulatory Visit (HOSPITAL_COMMUNITY): Payer: 59 | Admitting: Anesthesiology

## 2011-02-27 ENCOUNTER — Ambulatory Visit (HOSPITAL_COMMUNITY)
Admission: AD | Admit: 2011-02-27 | Discharge: 2011-02-28 | Disposition: A | Discharge status: Home / Self Care | Payer: 59 | Source: Ambulatory Visit | Attending: Obstetrics and Gynecology | Admitting: Obstetrics and Gynecology

## 2011-02-27 ENCOUNTER — Other Ambulatory Visit: Payer: Self-pay | Admitting: Obstetrics and Gynecology

## 2011-02-27 DIAGNOSIS — N831 Corpus luteum cyst of ovary, unspecified side: Secondary | ICD-10-CM | POA: Insufficient documentation

## 2011-02-27 DIAGNOSIS — D252 Subserosal leiomyoma of uterus: Secondary | ICD-10-CM | POA: Insufficient documentation

## 2011-02-27 DIAGNOSIS — Z9889 Other specified postprocedural states: Secondary | ICD-10-CM

## 2011-02-27 DIAGNOSIS — R634 Abnormal weight loss: Secondary | ICD-10-CM

## 2011-02-27 DIAGNOSIS — N838 Other noninflammatory disorders of ovary, fallopian tube and broad ligament: Secondary | ICD-10-CM | POA: Insufficient documentation

## 2011-02-27 DIAGNOSIS — N8300 Follicular cyst of ovary, unspecified side: Secondary | ICD-10-CM | POA: Insufficient documentation

## 2011-02-27 DIAGNOSIS — D251 Intramural leiomyoma of uterus: Secondary | ICD-10-CM | POA: Insufficient documentation

## 2011-02-27 SURGERY — ROBOTIC ASSISTED SUPRACERVICAL HYSTERECTOMY WITH BILATERAL SALPINGO OOPHORECTOMY
Anesthesia: General | Site: Abdomen | Wound class: Clean Contaminated

## 2011-02-27 MED ORDER — OXYCODONE-ACETAMINOPHEN 5-325 MG PO TABS
1.0000 | ORAL_TABLET | ORAL | Status: DC | PRN
  Administered 2011-02-27: 1 via ORAL
  Administered 2011-02-27: 2 via ORAL
  Administered 2011-02-28 (×3): 1 via ORAL
  Filled 2011-02-27 (×4): qty 1
  Filled 2011-02-27: qty 2

## 2011-02-27 MED ORDER — LACTATED RINGERS IR SOLN
Status: DC | PRN
  Administered 2011-02-27: 1

## 2011-02-27 MED ORDER — ONDANSETRON HCL 4 MG/2ML IJ SOLN: 4.0000 mg | Freq: Four times a day (QID) | INTRAMUSCULAR | Status: DC | PRN

## 2011-02-27 MED ORDER — PROPOFOL 10 MG/ML IV EMUL
INTRAVENOUS | Status: DC | PRN
  Administered 2011-02-27: 160 mg via INTRAVENOUS

## 2011-02-27 MED ORDER — CLINDAMYCIN PHOSPHATE 600 MG/50ML IV SOLN
INTRAVENOUS | Status: DC | PRN
  Administered 2011-02-27: 900 mg via INTRAVENOUS

## 2011-02-27 MED ORDER — GLYCOPYRROLATE 0.2 MG/ML IJ SOLN
INTRAMUSCULAR | Status: DC | PRN
  Administered 2011-02-27: .8 mg via INTRAVENOUS
  Administered 2011-02-27: 0.2 mg via INTRAVENOUS

## 2011-02-27 MED ORDER — FENTANYL CITRATE 0.05 MG/ML IJ SOLN
INTRAMUSCULAR | Status: AC
  Filled 2011-02-27: qty 5

## 2011-02-27 MED ORDER — FENTANYL CITRATE 0.05 MG/ML IJ SOLN
25.0000 ug | INTRAMUSCULAR | Status: DC | PRN
  Administered 2011-02-27: 50 ug via INTRAVENOUS

## 2011-02-27 MED ORDER — GLYCOPYRROLATE 0.2 MG/ML IJ SOLN
INTRAMUSCULAR | Status: AC
  Filled 2011-02-27: qty 10

## 2011-02-27 MED ORDER — ROCURONIUM BROMIDE 100 MG/10ML IV SOLN
INTRAVENOUS | Status: DC | PRN
  Administered 2011-02-27: 40 mg via INTRAVENOUS
  Administered 2011-02-27: 60 mg via INTRAVENOUS
  Administered 2011-02-27: 20 mg via INTRAVENOUS

## 2011-02-27 MED ORDER — GLYCOPYRROLATE 0.2 MG/ML IJ SOLN
INTRAMUSCULAR | Status: AC
Start: 2011-02-27 — End: 2011-02-27
  Filled 2011-02-27: qty 5

## 2011-02-27 MED ORDER — KETOROLAC TROMETHAMINE 30 MG/ML IJ SOLN: 30.0000 mg | Freq: Four times a day (QID) | INTRAMUSCULAR | Status: DC

## 2011-02-27 MED ORDER — SODIUM CHLORIDE 0.9 % IJ SOLN
3.0000 mL | Freq: Two times a day (BID) | INTRAMUSCULAR | Status: DC
  Administered 2011-02-27 (×2): 3 mL via INTRAVENOUS

## 2011-02-27 MED ORDER — PROPOFOL 10 MG/ML IV EMUL
INTRAVENOUS | Status: AC
  Filled 2011-02-27: qty 20

## 2011-02-27 MED ORDER — DEXTROSE IN LACTATED RINGERS 5 % IV SOLN: INTRAVENOUS | Status: AC

## 2011-02-27 MED ORDER — CIPROFLOXACIN IN D5W 400 MG/200ML IV SOLN
INTRAVENOUS | Status: DC | PRN
  Administered 2011-02-27: 400 mg via INTRAVENOUS

## 2011-02-27 MED ORDER — KETOROLAC TROMETHAMINE 30 MG/ML IJ SOLN
INTRAMUSCULAR | Status: DC | PRN
  Administered 2011-02-27: 30 mg via INTRAVENOUS

## 2011-02-27 MED ORDER — MIDAZOLAM HCL 5 MG/5ML IJ SOLN
INTRAMUSCULAR | Status: DC | PRN
  Administered 2011-02-27: 2 mg via INTRAVENOUS

## 2011-02-27 MED ORDER — INDIGOTINDISULFONATE SODIUM 8 MG/ML IJ SOLN
INTRAMUSCULAR | Status: DC | PRN
  Administered 2011-02-27: 5 mL via INTRAVENOUS

## 2011-02-27 MED ORDER — DEXAMETHASONE SODIUM PHOSPHATE 10 MG/ML IJ SOLN
INTRAMUSCULAR | Status: DC | PRN
  Administered 2011-02-27: 10 mg via INTRAVENOUS

## 2011-02-27 MED ORDER — ONDANSETRON HCL 4 MG/2ML IJ SOLN
INTRAMUSCULAR | Status: DC | PRN
  Administered 2011-02-27: 4 mg via INTRAMUSCULAR

## 2011-02-27 MED ORDER — CIPROFLOXACIN IN D5W 400 MG/200ML IV SOLN
400.0000 mg | INTRAVENOUS | Status: DC
  Filled 2011-02-27: qty 200

## 2011-02-27 MED ORDER — HYDROMORPHONE HCL 1 MG/ML IJ SOLN
INTRAMUSCULAR | Status: AC
  Filled 2011-02-27: qty 1

## 2011-02-27 MED ORDER — DEXTROSE-NACL 5-0.45 % IV SOLN: INTRAVENOUS | Status: DC

## 2011-02-27 MED ORDER — LORATADINE 10 MG PO TABS
10.0000 mg | ORAL_TABLET | Freq: Every day | ORAL | Status: DC
  Filled 2011-02-27 (×2): qty 1

## 2011-02-27 MED ORDER — LIDOCAINE HCL (CARDIAC) 20 MG/ML IV SOLN
INTRAVENOUS | Status: DC | PRN
  Administered 2011-02-27: 50 mg via INTRAVENOUS

## 2011-02-27 MED ORDER — STERILE WATER FOR IRRIGATION IR SOLN
Status: DC | PRN
  Administered 2011-02-27: 1

## 2011-02-27 MED ORDER — LEVOTHYROXINE SODIUM 50 MCG PO TABS
50.0000 ug | ORAL_TABLET | Freq: Every day | ORAL | Status: DC
  Filled 2011-02-27 (×2): qty 1

## 2011-02-27 MED ORDER — ROCURONIUM BROMIDE 50 MG/5ML IV SOLN
INTRAVENOUS | Status: AC
  Filled 2011-02-27: qty 2

## 2011-02-27 MED ORDER — HYDROMORPHONE HCL 1 MG/ML IJ SOLN
INTRAMUSCULAR | Status: DC | PRN
  Administered 2011-02-27 (×2): 1 mg via INTRAVENOUS

## 2011-02-27 MED ORDER — IBUPROFEN 800 MG PO TABS
800.0000 mg | ORAL_TABLET | Freq: Three times a day (TID) | ORAL | Status: DC | PRN
  Administered 2011-02-27 – 2011-02-28 (×2): 800 mg via ORAL
  Filled 2011-02-27 (×2): qty 1

## 2011-02-27 MED ORDER — SODIUM CHLORIDE 0.9 % IJ SOLN: 3.0000 mL | INTRAMUSCULAR | Status: DC | PRN

## 2011-02-27 MED ORDER — FENTANYL CITRATE 0.05 MG/ML IJ SOLN
INTRAMUSCULAR | Status: DC | PRN
  Administered 2011-02-27 (×2): 100 ug via INTRAVENOUS
  Administered 2011-02-27: 150 ug via INTRAVENOUS

## 2011-02-27 MED ORDER — NEOSTIGMINE METHYLSULFATE 1 MG/ML IJ SOLN
INTRAMUSCULAR | Status: DC | PRN
  Administered 2011-02-27: 4 mg via INTRAMUSCULAR

## 2011-02-27 MED ORDER — ONDANSETRON HCL 4 MG/2ML IJ SOLN
INTRAMUSCULAR | Status: AC
  Filled 2011-02-27: qty 2

## 2011-02-27 MED ORDER — LACTATED RINGERS IV SOLN
INTRAVENOUS | Status: DC
  Administered 2011-02-27: 08:00:00 via INTRAVENOUS

## 2011-02-27 MED ORDER — CLINDAMYCIN PHOSPHATE 900 MG/50ML IV SOLN
900.0000 mg | INTRAVENOUS | Status: DC
  Filled 2011-02-27: qty 50

## 2011-02-27 MED ORDER — LIDOCAINE HCL (CARDIAC) 20 MG/ML IV SOLN
INTRAVENOUS | Status: AC
  Filled 2011-02-27: qty 5

## 2011-02-27 MED ORDER — TAMOXIFEN CITRATE 10 MG PO TABS
20.0000 mg | ORAL_TABLET | Freq: Every day | ORAL | Status: DC
  Filled 2011-02-27 (×2): qty 2

## 2011-02-27 MED ORDER — ERGOCALCIFEROL 50 MCG (2000 UT) PO TABS: 1.0000 | ORAL_TABLET | Freq: Every day | ORAL | Status: DC

## 2011-02-27 MED ORDER — KETOROLAC TROMETHAMINE 30 MG/ML IJ SOLN
INTRAMUSCULAR | Status: AC
  Filled 2011-02-27: qty 1

## 2011-02-27 MED ORDER — VITAMIN D 1000 UNITS PO TABS
2000.0000 [IU] | ORAL_TABLET | Freq: Every day | ORAL | Status: DC
  Filled 2011-02-27 (×2): qty 2

## 2011-02-27 MED ORDER — DEXAMETHASONE SODIUM PHOSPHATE 10 MG/ML IJ SOLN
INTRAMUSCULAR | Status: AC
  Filled 2011-02-27: qty 1

## 2011-02-27 MED ORDER — NEOSTIGMINE METHYLSULFATE 1 MG/ML IJ SOLN
INTRAMUSCULAR | Status: AC
  Filled 2011-02-27: qty 10

## 2011-02-27 MED ORDER — METOCLOPRAMIDE HCL 5 MG/ML IJ SOLN
10.0000 mg | Freq: Once | INTRAMUSCULAR | Status: AC
  Administered 2011-02-27: 10 mg via INTRAVENOUS

## 2011-02-27 MED ORDER — ROCURONIUM BROMIDE 50 MG/5ML IV SOLN
INTRAVENOUS | Status: AC
  Filled 2011-02-27: qty 1

## 2011-02-27 SURGICAL SUPPLY — 64 items
APL SKNCLS STERI-STRIP NONHPOA (GAUZE/BANDAGES/DRESSINGS) ×1
BARRIER ADHS 3X4 INTERCEED (GAUZE/BANDAGES/DRESSINGS) ×2 IMPLANT
BENZOIN TINCTURE PRP APPL 2/3 (GAUZE/BANDAGES/DRESSINGS) ×2 IMPLANT
BLADE LAPAROSCOPIC MORCELL KIT (BLADE) ×1 IMPLANT
BLADELESS LONG 8MM (BLADE) IMPLANT
BRR ADH 4X3 ABS CNTRL BYND (GAUZE/BANDAGES/DRESSINGS) ×1
CABLE HIGH FREQUENCY MONO STRZ (ELECTRODE) ×2 IMPLANT
CHLORAPREP W/TINT 26ML (MISCELLANEOUS) ×2 IMPLANT
CLOTH BEACON ORANGE TIMEOUT ST (SAFETY) ×2 IMPLANT
CONT PATH 16OZ SNAP LID 3702 (MISCELLANEOUS) ×2 IMPLANT
COVER MAYO STAND STRL (DRAPES) ×2 IMPLANT
COVER TABLE BACK 60X90 (DRAPES) ×4 IMPLANT
COVER TIP SHEARS 8 DVNC (MISCELLANEOUS) ×1 IMPLANT
COVER TIP SHEARS 8MM DA VINCI (MISCELLANEOUS) ×1
DECANTER SPIKE VIAL GLASS SM (MISCELLANEOUS) IMPLANT
DERMABOND ADVANCED (GAUZE/BANDAGES/DRESSINGS) ×6 IMPLANT
DRAPE HUG U DISPOSABLE (DRAPE) ×2 IMPLANT
DRAPE HYSTEROSCOPY (DRAPE) IMPLANT
DRAPE LG THREE QUARTER DISP (DRAPES) ×4 IMPLANT
DRAPE MONITOR DA VINCI (DRAPE) ×2 IMPLANT
DRAPE UTILITY XL STRL (DRAPES) IMPLANT
DRAPE WARM FLUID 44X44 (DRAPE) ×2 IMPLANT
ELECT REM PT RETURN 9FT ADLT (ELECTROSURGICAL) ×2
ELECTRODE REM PT RTRN 9FT ADLT (ELECTROSURGICAL) ×1 IMPLANT
EVACUATOR SMOKE 8.L (FILTER) ×2 IMPLANT
GAUZE VASELINE 3X9 (GAUZE/BANDAGES/DRESSINGS) IMPLANT
GLOVE BIO SURGEON STRL SZ7 (GLOVE) ×9 IMPLANT
GLOVE ECLIPSE 6.5 STRL STRAW (GLOVE) ×6 IMPLANT
GOWN BRE IMP SLV AUR LG STRL (GOWN DISPOSABLE) ×12 IMPLANT
KIT DISP ACCESSORY 4 ARM (KITS) ×2 IMPLANT
MANIPULATOR UTERINE 4.5 ZUMI (MISCELLANEOUS) IMPLANT
NEEDLE INSUFFLATION 14GA 120MM (NEEDLE) ×2 IMPLANT
NS IRRIG 1000ML POUR BTL (IV SOLUTION) ×6 IMPLANT
OCCLUDER COLPOPNEUMO (BALLOONS) ×6 IMPLANT
PACK LAVH (CUSTOM PROCEDURE TRAY) ×2 IMPLANT
PAD PREP 24X48 CUFFED NSTRL (MISCELLANEOUS) ×4 IMPLANT
POSITIONER SURGICAL ARM (MISCELLANEOUS) ×4 IMPLANT
RUMI II 3.0CM BLUE KOH-EFFICIE (DISPOSABLE) ×2 IMPLANT
SET CYSTO W/LG BORE CLAMP LF (SET/KITS/TRAYS/PACK) ×2 IMPLANT
SET IRRIG TUBING LAPAROSCOPIC (IRRIGATION / IRRIGATOR) ×2 IMPLANT
SOLUTION ELECTROLUBE (MISCELLANEOUS) ×2 IMPLANT
SPONGE LAP 18X18 X RAY DECT (DISPOSABLE) IMPLANT
STRIP CLOSURE SKIN 1/2X4 (GAUZE/BANDAGES/DRESSINGS) ×2 IMPLANT
SUT VIC AB 0 CT1 27 (SUTURE) ×4
SUT VIC AB 0 CT1 27XBRD ANBCTR (SUTURE) ×5 IMPLANT
SUT VIC AB 2-0 CT2 27 (SUTURE) IMPLANT
SUT VICRYL 0 UR6 27IN ABS (SUTURE) ×4 IMPLANT
SUT VICRYL RAPIDE 3 0 (SUTURE) ×4 IMPLANT
SYR 50ML LL SCALE MARK (SYRINGE) ×2 IMPLANT
SYSTEM CONVERTIBLE TROCAR (TROCAR) ×1 IMPLANT
TIP UTERINE 5.1X6CM LAV DISP (MISCELLANEOUS) ×2 IMPLANT
TIP UTERINE 6.7X10CM GRN DISP (MISCELLANEOUS) IMPLANT
TIP UTERINE 6.7X6CM WHT DISP (MISCELLANEOUS) ×2 IMPLANT
TIP UTERINE 6.7X8CM BLUE DISP (MISCELLANEOUS) IMPLANT
TOWEL OR 17X24 6PK STRL BLUE (TOWEL DISPOSABLE) ×4 IMPLANT
TRAY FOLEY BAG SILVER LF 14FR (CATHETERS) ×2 IMPLANT
TROCAR DISP BLADELESS 8 DVNC (TROCAR) ×1 IMPLANT
TROCAR DISP BLADELESS 8MM (TROCAR) ×1
TROCAR XCEL 12X100 BLDLESS (ENDOMECHANICALS) ×2 IMPLANT
TROCAR XCEL NON-BLD 5MMX100MML (ENDOMECHANICALS) ×2 IMPLANT
TROCAR Z-THREAD 12X150 (TROCAR) IMPLANT
TROCAR Z-THREAD BLADED 12X100M (TROCAR) IMPLANT
TUBING FILTER THERMOFLATOR (ELECTROSURGICAL) ×2 IMPLANT
WATER STERILE IRR 1000ML UROMA (IV SOLUTION) ×2 IMPLANT

## 2011-02-27 NOTE — Progress Notes (Signed)
  Patient is eating, ambulating, is voiding.  Pain control is good.  VSS.     lungs:   clear to auscultation cor:    RRR Abdomen:  soft, appropriate tenderness, incisions intact and without erythema or exudate. ex:    no cords   .lastlab[cbc, cmet -pending  A/P  Routine care.  Expect d/c per plan.

## 2011-02-27 NOTE — Progress Notes (Signed)
Subjective: Patient reports no problems voiding.    Objective: I have reviewed patient's vital signs.  no change   Assessment/Plan No change. Proceed as planned  LOS: 0 days    Amanda Jensen A 02/27/2011, 7:46 AM

## 2011-02-27 NOTE — Transfer of Care (Signed)
Immediate Anesthesia Transfer of Care Note  Patient: Amanda Jensen  Procedure(s) Performed:  ROBOTIC ASSISTED SUPRACERVICAL HYSTERECTOMY WITH BILATERAL SALPINGO OOPHERECTOMY - Robotic Assisted Supracervical Hysterectomy with Left Oophorectomy, Bilateral Salpingectomies, and Cystoscopy  Patient Location: PACU  Anesthesia Type: General  Level of Consciousness: awake, alert  and oriented  Airway & Oxygen Therapy: Patient Spontanous Breathing and Patient connected to nasal cannula oxygen  Post-op Assessment: Report given to PACU RN and Post -op Vital signs reviewed and stable  Post vital signs: Reviewed and stable  Complications: No apparent anesthesia complications

## 2011-02-27 NOTE — Anesthesia Postprocedure Evaluation (Signed)
  Anesthesia Post-op Note  Patient: Amanda Jensen  Procedure(s) Performed:  ROBOTIC ASSISTED SUPRACERVICAL HYSTERECTOMY WITH BILATERAL SALPINGO OOPHERECTOMY - Robotic Assisted Supracervical Hysterectomy with Left Oophorectomy, Bilateral Salpingectomies, and Cystoscopy  No anesthesia complications.  Level of consciousness: alert. Cardiopulmonary status stable.  No follow-up care or observation required.  Abie Cheek L. Rodman Pickle, MD

## 2011-02-27 NOTE — Op Note (Signed)
Preoperative Diagnosis:  Symptomatic fibroid uterus.  Postoperative Diagnosis:  Same plus multiple Left ovarian cysts.  Procedure:  Robotic Assisted Supracervical Hysterectomy with bilateral salpingectomies and Left oophorectomy.  Attending: Loney Laurence  Assistant: Leilani Able, PA  Anesthesia:  General endotracheal.  Estimated blood loss:  200 cc.  IV fluids:  1500 cc.  Urine:  700 cc.  Complications:  None.  Findings:  15 weeks bulky uterus with multiple fibroids.  Left ovary has multiple cysts and was enlarged.  The ureters were identified during multiple points of the case and were always out of the field of dissection.  On cystoscopy, the bladder was intact and bilateral spill was seen from each ureteral orriface.    Medications:  Clindamycin and Ciprofloxin.  Interceed.  Indigo carmine.    Technique:  After adequate anesthesia was achieved the patient was positioned, prepped and draped in usual sterile fashion.  A speculum was placed in the vagina and the cervix dilated with pratt dilators.  The 6 cm Rumi and 3 cm Koh ring were assembled and placed in proper fashion.  The  Speculum was removed and the bladder catheterized with a foley.  \  Attention was turned to the abdomen where a 1 cm incision was made 5 cm above the umbilicus.  The fascia was tented up and incised with a scalpel.  Each angle was secured with a stitch of 2-vicryl and the peritoneum entered into bluntly with my finger.  The long 12 mm trocar was placed and the other four trocar sites were marked out, all approximately 10 cm from each other and the camera.  Three 8.5 mm trocars were placed on either side of the camera port and 3 cm above the iliac crest on the right side.  A 5 mm assistant port was placed opposite the left lower trocar.  All trocars were inserted under direct visualization of the camera.  The patient was placed in trendelenburg and then the Robot docked.  The PK forceps were placed on  arm 2, the Prograsp on arm 3 and the Hot shears on arm 1 and introduced under direct visualization of the camera  I then broke scrub and sat down at the console.  The above findings were noted and the ureters identified well out of the field of dissection.  The peritoneum on the left was opened parallel to the IP ligament and the IP ligament isolated.  This was cauterized with the PK and then divided with the scissors.  The posterior broad ligament was then divided with the hot shears until the uterosacral ligament.  The anterior leaf was then incised at the reflection of the vessico-uterine junction and the bladder retracted inferiorly after the round ligament had been divided with the PK forceps.  The right tube was removed from the right ovary with the hot shears and then the utero-ovarian ligament divided with the PK forceps and the scissors.  The round ligament was divided as well and the posterior leaf of the broad ligament then divided with the hot shears.  At the level of the internal os, the uterine arteries were bilaterally cauterized with the PK.  The ureters were identified well out of the field of dissection.     The uterus was then removed by amputating the lower uterine segment with the hot shears.  The Rumi instrument was removed, the cervical canal cauterized and the cervical stump secured with two figure of eight stitches of 0-vicryl.  Hemostasis was achieved with additional cautery  and checked with the insufflation pressure lowered 2 times.  A small amount of bleeding on the epiploica of the descending colon was cauterized carefully with the hot shears well away from the bowel itself.  A piece of Interceed was then placed and sutured in place over the cervical stump.    The Robot was then undocked and I scrubbed back in.  The 5 mm scope was placed and the 15 cm Storz Morcellator was introduced into the camera port site.  Careful morcellation was performed and all visible debris removed.  The  left ovary had been separated from the uterus and was removed in toto with an endobag.  Irrigation was performed and all instruments removed after the abdomen had been carefully desufflated.  The 15 cm camera/morcellator port was closed with a running stitch of 2-0 vicryl.  The skin incisions were closed with subcuticular stitches of 3-0 vicryl Rapide and Dermabond.  All instruments were removed from the vagina and cystoscopy performed, revealing an intact bladder and spill of indigo carmine from each ureteral orifice.  The cystoscope was removed and the patient taken to the recovery room in stable condition.

## 2011-02-27 NOTE — Addendum Note (Signed)
Addendum  created 02/27/11 1341 by Tyrone Apple. [employer] edited:Orders, [employer] Based Tax adviser

## 2011-02-27 NOTE — Anesthesia Procedure Notes (Addendum)
Performed by: GREGORY, SUZANNE     

## 2011-02-27 NOTE — Anesthesia Preprocedure Evaluation (Addendum)
Anesthesia Evaluation  Name, MR# and DOB Patient awake  General Assessment Comment  Reviewed: Allergy & Precautions, H&P  and Patient's Chart, lab work & pertinent test results  History of Anesthesia Complications (+) AWARENESS UNDER ANESTHESIA  Airway Mallampati: III TM Distance: >3 FB Neck ROM: Full    Dental No notable dental hx (+) Teeth Intact   Pulmonaryneg pulmonary ROS      pulmonary exam normal   Cardiovascular Regular Normal   Neuro/Psych (+) {AN ROS/MED HX NEURO HEADACHES (+) Anxiety, Depression, Negative Neurological ROS Negative Psych ROS  GI/Hepatic/Renal negative GI ROS, negative Liver ROS, and negative Renal ROS (+)       Endo/Other  Negative Endocrine ROS (+)   Abdominal Normal abdominal exam  (+)   Musculoskeletal negative musculoskeletal ROS (+)  Hematology negative hematology ROS (+)   Peds  Reproductive/Obstetrics negative OB ROS   Anesthesia Other Findings See paper chart            Anesthesia Physical Anesthesia Plan  ASA: II  Anesthesia Plan: General   Post-op Pain Management:    Induction: Intravenous  Airway Management Planned: Oral ETT  Additional Equipment:   Intra-op Plan:   Post-operative Plan: Extubation in OR  Informed Consent: I have reviewed the patients History and Physical, chart, labs and discussed the procedure including the risks, benefits and alternatives for the proposed anesthesia with the patient or authorized representative who has indicated his/her understanding and acceptance.   Dental advisory given  Plan Discussed with: Anesthesiologist (AP), CRNA and Surgeon  Anesthesia Plan Comments:        Anesthesia Quick Evaluation

## 2011-02-27 NOTE — Anesthesia Postprocedure Evaluation (Signed)
  Anesthesia Post-op Note  Patient: Amanda Jensen  Procedure(s) Performed:  ROBOTIC ASSISTED SUPRACERVICAL HYSTERECTOMY WITH BILATERAL SALPINGO OOPHERECTOMY - Robotic Assisted Supracervical Hysterectomy with Left Oophorectomy, Bilateral Salpingectomies, and Cystoscopy  Patient Location: PACU  Anesthesia Type: General  Level of Consciousness: awake, alert  and oriented  Airway and Oxygen Therapy: Patient Spontanous Breathing  Post-op Pain: none  Post-op Assessment: Post-op Vital signs reviewed and Patient's Cardiovascular Status Stable  Post-op Vital Signs: Reviewed and stable  Complications: No apparent anesthesia complications

## 2011-02-28 LAB — CBC
MCH: 32.2 pg (ref 26.0–34.0)
MCV: 92.8 fL (ref 78.0–100.0)
Platelets: 186 10*3/uL (ref 150–400)
RDW: 12.1 % (ref 11.5–15.5)
WBC: 14.8 10*3/uL — ABNORMAL HIGH (ref 4.0–10.5)

## 2011-02-28 MED ORDER — SODIUM CHLORIDE 0.9 % IV SOLN: 250.0000 mL | INTRAVENOUS | Status: DC

## 2011-02-28 MED ORDER — OXYCODONE-ACETAMINOPHEN 5-325 MG PO TABS: 1.0000 | ORAL_TABLET | ORAL | Status: AC | PRN

## 2011-02-28 NOTE — Discharge Summary (Signed)
Physician Discharge Summary  Patient ID: Amanda Jensen MRN: [id number] DOB/AGE: 12/27/1965 44 y.o.  Admit date: 02/27/2011 Discharge date: 02/28/2011  Admission Diagnoses:  Discharge Diagnoses:  Active Problems:  * No active hospital problems. *    Discharged Condition: good  Hospital Course: routine post-op  Consults: none  Significant Diagnostic Studies:   Treatments: surgery: Robotic assisted laparoscopic supracervical hysterectomy, bilateral salpingectomies, left oopherectomy, and cystoscopy.  Discharge Exam: Blood pressure 91/55, pulse 107, temperature 99 F (37.2 C), temperature source Oral, resp. rate 20, height 5\' 5"  (1.651 m), weight 63.504 kg (140 lb), SpO2 96.00%. see note for pe.  Disposition:   Discharge Orders    Future Orders Please Complete By Expires   Increase activity slowly      Discharge instructions      Comments:   Ambulate. Nothing per vagina for 6 weeks. No bath- shower ok. Increase fiber and hydration. Motrin OTC for pain. Colace OTC for stool softner.   Discharge wound care:      Comments:   Keep clean and dry.     Current Discharge Medication List    START taking these medications   Details  oxyCODONE-acetaminophen (PERCOCET) 5-325 MG per tablet Take 1-2 tablets by mouth every 3 (three) hours as needed for pain (moderate to severe pain (when tolerating fluids)). Qty: 30 tablet, Refills: 0      CONTINUE these medications which have NOT CHANGED   Details  cetirizine (ZYRTEC) 10 MG tablet Take 10 mg by mouth daily.      Ergocalciferol 2000 UNITS TABS Take 1 tablet by mouth daily.      ESZOPICLONE PO Take 1.5 mg by mouth at bedtime as needed. For sleep     ibuprofen (ADVIL,MOTRIN) 600 MG tablet Take 600 mg by mouth every 8 (eight) hours as needed. For pain     levothyroxine (SYNTHROID, LEVOTHROID) 50 MCG tablet Take 50 mcg by mouth daily.      tamoxifen (NOLVADEX) 20 MG tablet Take 20 mg by mouth daily.         Follow-up  Information    Follow up with Raiya Stainback A. Make an appointment in 2 weeks.   Contact information:   [address] [place] [address] [phone]          Signed: Loney Laurence 02/28/2011, 7:55 AM

## 2011-02-28 NOTE — Progress Notes (Signed)
  Patient is eating, ambulating, voiding.  Pain control is good.  BP 91/55  Pulse 107  Temp(Src) 99 F (37.2 C) (Oral)  Resp 20  Ht 5\' 5"  (1.651 m)  Wt 63.504 kg (140 lb)  BMI 23.30 kg/m2  SpO2 96%   lungs:   clear to auscultation cor:    RRR Abdomen:  soft, appropriate tenderness, incisions intact and without erythema or exudate. ex:    no cor  Lab Results  Component Value Date   WBC 14.8* 02/28/2011   HGB 10.7* 02/28/2011   HCT 30.8* 02/28/2011   MCV 92.8 02/28/2011   PLT 186 02/28/2011     A/P  Routine care.  Expect d/c per plan.

## 2011-03-01 NOTE — Addendum Note (Signed)
Addendum  created 03/01/11 1602 by Tyrone Apple. Amanda Jensen   Modules edited:Charting, Inpatient Notes

## 2011-06-03 LAB — CBC
HCT: 40.2
Hemoglobin: 13.6
MCHC: 33.9
MCV: 94.9
Platelets: 262
RBC: 4.24
RDW: 12.6
WBC: 7.5

## 2011-07-05 ENCOUNTER — Telehealth: Payer: Self-pay | Admitting: Oncology

## 2011-07-05 NOTE — Telephone Encounter (Signed)
Talked to pt, reminded pt of appt for lab on 11/19th, she will call us to r/s once her January calendar comes out.

## 2011-07-08 ENCOUNTER — Ambulatory Visit (HOSPITAL_BASED_OUTPATIENT_CLINIC_OR_DEPARTMENT_OTHER): Payer: 59 | Admitting: Oncology

## 2011-07-08 ENCOUNTER — Other Ambulatory Visit: Payer: 59 | Admitting: Lab

## 2011-07-08 DIAGNOSIS — Z90711 Acquired absence of uterus with remaining cervical stump: Secondary | ICD-10-CM

## 2011-07-08 DIAGNOSIS — Z170 Estrogen receptor positive status [ER+]: Secondary | ICD-10-CM

## 2011-07-08 DIAGNOSIS — D0590 Unspecified type of carcinoma in situ of unspecified breast: Secondary | ICD-10-CM

## 2011-07-08 DIAGNOSIS — E039 Hypothyroidism, unspecified: Secondary | ICD-10-CM

## 2011-07-08 NOTE — Progress Notes (Signed)
[facility]  HEMATOLOGY/MEDICAL ONCOLOGY [address] [place], [place]  [address] Phone:  [phone] Fax:  [phone]   NEW PATIENT EVALUATION   NAME:  Amanda Jensen, Amanda Jensen DATE:  11/07/2008 MRN:  [id number] DOB:  [birth year]  CC:  Currie Paris, M.D. [facility]   Loney Laurence, M.D.   Lurline Hare, M.D.   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  This showed an 8 mm cluster of calcifications in the upper outer quadrant of the left breast.  Stereotactic biopsy was done the same day.  Pathology showed ductal carcinoma in situ in a sclerosing papilloma with associated calcifications.  Six core biopsies were submitted to Pathology.  Estrogen receptor and progesterone receptor both positive 100%.  She had a breast MRI which showed a 7 x 5 x 8 mm enhancing nodule in the right breast, a probable intramammary lymph node in the posterior upper inner quadrant of the left breast measuring 6 mm.  A biopsy of the lesion on the right was consistent with a fibroadenoma.    She was referred to Dr. Cicero Duck.  A partial mastectomy is planned for this week on 3/24.    She has had no previous breast biopsies.  She had her menarche at age 38.  She had an endometrial ablation procedure by Dr. Carrington Clamp about two years ago for menorrhagia associated with fibroids.  She has never been pregnant.  She was on Yaz oral contraceptive for about a year and was on a NuvaRing for about a year but has had no prolonged use of any estrogen preparations.    FAMILY HISTORY: Her mother had breast cancer in her mid-50s but died of an MI at age 6.  Mother was a heavy  smoker and drinker.  She doesn't know anything about her father's history.  She has a sister, age 55 and a sister, age 82 who have not had any breast problems.  PAST MEDICAL HISTORY includes hypothyroid on replacement for about 5 years.  Only other surgery was sinus surgery about 4 years ago by Dr. Annalee Genta for a deviated septum.  She has no history of hypertension, diabetes or ulcers.  She does get exercise-induced asthma.  No history of kidney stones or kidney problems, hepatitis, yellow jaundice, blood clots, seizure, or stroke.    CURRENT MEDICATIONS include: 1. Synthroid 75 mcg daily. 2. Prozac 40 mg daily to control PMS symptoms. 3. Benadryl p.r.n.  4. Patanol p.r.n.   ALLERGIES TO ERYTHROMYCIN, SULFA AND PENICILLIN.  SOCIAL HISTORY:  She is in a steady relationship with another woman.  She does not smoke.  Rare alcohol use.  No recreational drugs.    REVIEW OF SYSTEMS:  Unremarkable.    PHYSICAL EXAM:  Healthy appearing Caucasian woman, blood pressure 89/59, pulse 70 regular.  She is 148 pounds, 5 feet 4  inches tall.  Skin, hair and nails normal.  Pharynx:  No erythema or exudate.  Neck is supple.  Carotids 2+, no bruits, no thyromegaly.  The lungs are clear, resonant to percussion.  Ecchymosis in the area of biopsy in the right breast.  No obvious changes in the left breast.  No dominant mass  in either breast.  Abdomen is soft, nontender.  No mass, no organomegaly.  Extremities:  No edema.  No calf tenderness.  Neurologic:  Motor strength 5/5, reflexes 2+ symmetric.  Sensation intact to vibration by tuning fork exam.   IMPRESSION:  DCIS on needle biopsy of an area of clustered calcifications, upper outer quadrant of the left breast, in a premenopausal woman.    We discussed management at length.  A number of large clinical trials in this country and in [place] have shown that the optimal treatment at present is a combination of surgery, radiation and tamoxifen.  Use of radiation alone  reduces local recurrence by about 50% in all studies reported with large numbers of women followed out for 10 years.  Local recurrence rates fall from 15 to 30% down to 6 to 12%.  The NSABP B24 study with 900 women on each arm looking at excision and radiation with or without tamoxifen showed a further decrease in recurrence of invasive cancers by 50%, non-significant decrease in recurrent DCIS but a 50% recurrence in disease in the contralateral breast.  Overall reduction in recurrence with the radiation plus tamoxifen was about 75% overall and 85% reduction in risk of invasive cancer recurrence (for estrogen receptor positive tumors).    If there is no invasive disease in the lumpectomy specimen, then she will not need a sentinel lymph node procedure.    Current recommendation for DCIS is not to follow with MRI.    At present,  gene array studies have not been applied to the management of DCIS although I am sure we will gain insights from these kinds of studies in the future.    PLAN:  I am recommending she proceed with her lumpectomy, then radiation, then tamoxifen.    Thank you very much for referring this patient. I will follow the patient with you and assist with management as necessary.    Electronically signed Genene Churn. Cyndie Chime, M.D., F.A.C.P.    [facility]   HEMATOLOGY/MEDICAL ONCOLOGY [address] [place], [place]  [address] Phone:  [phone] Fax:  [phone]   OFFICE PROGRESS NOTE  NAME:  KRESTA, TEMPLEMAN MRN:  [id number] DATE:  07/16/2010 DOB:  [birth year]  CC: Loney Laurence, MD  Currie Paris, MD  Lurline Hare, MD  Overall, Thayer Ohm is doing well.  She just got back from a trip to [place], [place].  She was going to go rafting, but the weather did not cooperate.   She is having no problems with tamoxifen.  She gets a rare hot flash.  She has not had a full menstrual period in about three years since an endometrial ablation  procedure done to control menorrhagia due to uterine fibroids.   PHYSICAL EXAMINATION:  On physical examination, complete resolution of all radiation changes left breast.  No dominant mass in either breast.  No lymphadenopathy. Lungs are clear.  Regular cardiac rhythm.  No murmur.  Abdomen soft, no mass.  No organomegaly.  Extremities reveal no edema.  No calf tenderness.    LABORATORY DATA:  Hemoglobin 12.4, hematocrit 35.7, MCV 96, white count 7,700, platelets 246,000.  Chemistry profile pending.   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  Primary tumor 0.6 cm, strongly ER/PR+.  Status post lumpectomy, status post radiation.  She remains stable on tamoxifen hormonal therapy.  Plan:  Continue the same.    Electronically signed Cephas Darby, M.D. 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  Thayer Ohm is doing well with no interim medical problems. She did have an elective hysterectomy with unilateral left oophorectomy on 02/27/2011 by Dr. Carrington Clamp. Previous surgery laboratory showed that she was still premenopausal with an FSH of 7.8 done on 02/21/2011.   She is not  experiencing any hot flashes at this time. She is not having any musculoskeletal complaints.  On exam head and neck are normal lungs are clear resonant to percussion regular cardiac rhythm no murmur no lymphadenopathy surgical changes left breast no dominant mass in either breast abdomen is soft nontender well-healed abdominal scars from recent laparoscopic/robotic hysterectomy and USO. Extremities no edema no calf tenderness.  Most recent mammogram was done in March and showed no new disease.  Impression #1. Stage IER positive DCIS left breast treated as outlined above she remains free  of any obvious new disease now add to half years from diagnosis in March of 2010.  Plan continue tamoxifen to complete 5 years. With just one functioning ovary I think it is premature to consider an aromatase inhibitor especially in the absence of any invasive disease at diagnosis.  #2 hypothyroid on replacement  #3. Fibroid uterus now status post hysterectomy with left USO.     _______________

## 2011-07-09 ENCOUNTER — Telehealth: Payer: Self-pay

## 2011-07-09 NOTE — Telephone Encounter (Signed)
Mailed appt °

## 2011-12-02 ENCOUNTER — Encounter: Payer: Self-pay | Admitting: Sports Medicine

## 2011-12-02 ENCOUNTER — Ambulatory Visit (INDEPENDENT_AMBULATORY_CARE_PROVIDER_SITE_OTHER): Payer: 59 | Admitting: Sports Medicine

## 2011-12-02 VITALS — BP 113/74 | HR 70 | Ht 64.5 in | Wt 140.0 lb

## 2011-12-02 DIAGNOSIS — M79671 Pain in right foot: Secondary | ICD-10-CM | POA: Insufficient documentation

## 2011-12-02 DIAGNOSIS — D219 Benign neoplasm of connective and other soft tissue, unspecified: Secondary | ICD-10-CM

## 2011-12-02 DIAGNOSIS — M79672 Pain in left foot: Secondary | ICD-10-CM

## 2011-12-02 DIAGNOSIS — M79609 Pain in unspecified limb: Secondary | ICD-10-CM

## 2011-12-02 NOTE — Assessment & Plan Note (Signed)
Today we did a metatarsal pad to her regular shoes  If this works well with metatarsal pads to other shoes and I gave her a sports insole to try with metatarsal pads to add  She plans a 500 mile bike ride and so we need to see her bike shoes and perhaps fit her for an insert that Will Cushion the foot during that ride

## 2011-12-02 NOTE — Assessment & Plan Note (Signed)
Patient will monitor this for any change in size or growth  While this has all the characteristics of being a benign soft tissue injury on ultrasound with her history of breast cancer if it enlarges becomes painful or changes in other ways I would encourage her to go for surgical biopsy  She will monitor this to see how it does over time

## 2011-12-02 NOTE — Progress Notes (Signed)
  Subjective:    Patient ID: Amanda Jensen, female    DOB: 1966/02/02, 46 y.o.   MRN: [id number]  HPI  Pt presents to clinic for eval of knot on left lower back x 7 years that is getting larger now. Had not been painful previously. Lifted a heavy couch last month and since then has had some intermittent sharp pain in the area of the knot and anterior hip.   Has bilat toe numbness after long bike rides. No trauma or injury. Has flat feet. Hx of breast CA at age 30- had lumpectomy and 6 weeks of radiation. Enjoys cycling- plans to do a 500 mile bike ride in August.     Review of Systems     Objective:   Physical Exam  Lumbar spine exam: Full forward flexion and extension with no pain Left SI joint non tender Nml rt SI joint  Firm mobile sharp border cyst like mass 3 cm in size Above left SI joint  Has pes planus bilat Lt MTs dropped with callusing Rt Morton's foot with callus and dropped MT heads   Musculoskeletal ultrasound reveals a discrete area that looks like fibrous tissue just above the left sacroiliac joint. This measures 3 cm in horizontal width and about 1 cm in vertical width. There is no abnormal Doppler activity. The tissue looks normal and is not fluid-filled.     Assessment & Plan:

## 2011-12-09 ENCOUNTER — Other Ambulatory Visit: Payer: Self-pay | Admitting: Obstetrics and Gynecology

## 2011-12-09 DIAGNOSIS — Z9889 Other specified postprocedural states: Secondary | ICD-10-CM

## 2011-12-09 DIAGNOSIS — Z853 Personal history of malignant neoplasm of breast: Secondary | ICD-10-CM

## 2011-12-12 ENCOUNTER — Other Ambulatory Visit: Payer: Self-pay | Admitting: Obstetrics and Gynecology

## 2011-12-12 ENCOUNTER — Ambulatory Visit
Admission: RE | Admit: 2011-12-12 | Discharge: 2011-12-12 | Disposition: A | Discharge status: Home / Self Care | Payer: 59 | Source: Ambulatory Visit | Attending: Obstetrics and Gynecology | Admitting: Obstetrics and Gynecology

## 2011-12-12 ENCOUNTER — Inpatient Hospital Stay: Admission: RE | Admit: 2011-12-12 | Payer: 59 | Source: Ambulatory Visit

## 2011-12-12 DIAGNOSIS — Z9889 Other specified postprocedural states: Secondary | ICD-10-CM

## 2011-12-12 DIAGNOSIS — Z853 Personal history of malignant neoplasm of breast: Secondary | ICD-10-CM

## 2011-12-23 ENCOUNTER — Encounter (INDEPENDENT_AMBULATORY_CARE_PROVIDER_SITE_OTHER): Payer: Self-pay | Admitting: Surgery

## 2011-12-24 ENCOUNTER — Encounter: Payer: 59 | Admitting: Sports Medicine

## 2012-01-01 ENCOUNTER — Other Ambulatory Visit: Payer: Self-pay | Admitting: *Deleted

## 2012-01-01 DIAGNOSIS — C50919 Malignant neoplasm of unspecified site of unspecified female breast: Secondary | ICD-10-CM

## 2012-01-01 MED ORDER — TAMOXIFEN CITRATE 20 MG PO TABS: 20.0000 mg | ORAL_TABLET | Freq: Every day | ORAL | Status: DC

## 2012-01-02 ENCOUNTER — Encounter: Payer: Self-pay | Admitting: *Deleted

## 2012-02-04 ENCOUNTER — Ambulatory Visit (INDEPENDENT_AMBULATORY_CARE_PROVIDER_SITE_OTHER): Payer: 59 | Admitting: Sports Medicine

## 2012-02-04 VITALS — BP 104/70 | Ht 65.0 in | Wt 140.0 lb

## 2012-02-04 DIAGNOSIS — M79609 Pain in unspecified limb: Secondary | ICD-10-CM

## 2012-02-04 DIAGNOSIS — M79671 Pain in right foot: Secondary | ICD-10-CM

## 2012-02-04 NOTE — Assessment & Plan Note (Signed)
We will start with a plan to tried metatarsal pads in her biking shoes These were placed today and did not cause pain  She was given a sports insole with metatarsal pads to try for her running shoes  She is to follow her symptoms over the next month to see if this causes enough improvement  She can also supplement B6  If this is not improving we'll try different up padding or a custom orthotic

## 2012-02-04 NOTE — Progress Notes (Signed)
  Subjective:    Patient ID: Amanda Jensen, female    DOB: [birth year], 46 y.o.   MRN: [id number]  HPI  This patient is an avid cycler. She has been experiencing numbness on the plantar surface of her foot distally bilaterally. Starts more to the tips of the toes. She experiences this primarily with cycling but also when she does some running. She has some pain in the forefoot but is not severe. She does not relate this to any specific injury.  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 Flattening of the longitudinal and transverse archs      Assessment & Plan:

## 2012-02-17 ENCOUNTER — Encounter: Payer: Self-pay | Admitting: Family Medicine

## 2012-02-17 ENCOUNTER — Ambulatory Visit (INDEPENDENT_AMBULATORY_CARE_PROVIDER_SITE_OTHER): Payer: 59 | Admitting: Family Medicine

## 2012-02-17 VITALS — BP 110/72 | HR 76 | Ht 64.0 in | Wt 142.0 lb

## 2012-02-17 DIAGNOSIS — Z0000 Encounter for general adult medical examination without abnormal findings: Secondary | ICD-10-CM

## 2012-02-17 DIAGNOSIS — Z23 Encounter for immunization: Secondary | ICD-10-CM

## 2012-02-17 DIAGNOSIS — E039 Hypothyroidism, unspecified: Secondary | ICD-10-CM

## 2012-02-17 MED ORDER — LEVOTHYROXINE SODIUM 100 MCG PO TABS: 100.0000 ug | ORAL_TABLET | Freq: Every day | ORAL | Status: DC

## 2012-02-17 NOTE — Patient Instructions (Addendum)

## 2012-02-17 NOTE — Progress Notes (Signed)
Chief Complaint  Patient presents with  . Annual Exam    new patient non fasting CPE-does not need pap/pelvic,sees Dr.Horvath and had pelvic exam ~ April 2013. Also brought in labs today. And did not do eye exam as patient stated that shes opthamologist. Also did not do UA and patient went while in waiting room and also Dr.Horvath just did one end of May and it was normal.   Patient presents to establish care, and for CPE.  She brings in recent labs from Dr. Henderson Cloud and from [facility].  She has no specific complaints today.  Vitamin D was low 22.5 in May--this was while on 1000 IU of D and a Ca+D supplement once daily; since then increased to a total of about 5000 IU daily.  Hypothyroidism--labs 10/2011 TSH 5.324 on 88 mcg generic synthroid.  Dose increased to 100 mcg and repeat end of May was 1.638.  Constipation improved, fatigue, dry skin  Health Maintenance: Immunization History  Administered Date(s) Administered  . Influenza Split 04/20/2011  Cannot recall last tetanus--thinks it may have been prior to residency in 2002 Last Pap smear: per Dr. Henderson Cloud Last mammogram: April 2013 Last colonoscopy: never Last DEXA: normal approx 3 years ago Ophtho: yearly Dentist: twice yearly Exercise: 3-4 days/week (bike, jogging, gym)  Past Medical History  Diagnosis Date  . Complication of anesthesia     succinyl choline reacttion-intense muscle a ches  . Cancer     history of left breast cancer-on tamoxifen  . Headache     occasional migraine  . Sinus infection     recent sinus infection -on clindamycin 150mg  q6 hr  . Allergy history unknown     seasonal-on zyrtec  . Constipation     hypothyroidism related(goes away with med adjustment)  . Depression     h/o-not currently on med  . Hypothyroidism     synthroid  . Rosacea   . Dry eyes     Past Surgical History  Procedure Date  . Breast surgery     left lumpectomy-2010  . Functional endoscopic sinus surgery     repair  deviated septum-2007  . Laser ablation     2008; endometrial  . Tonsillectomy     age 65  . Supracervical hysterectomy and lso     Da Vinci robot; done for fibroids  . Hand surgery x 2  (2007, 2008)    R hand (dog and cat bites) I&D    History   Social History  . Marital Status: Single    Spouse Name: N/A    Number of Children: N/A  . Years of Education: N/A   Occupational History  . Not on file.   Social History Main Topics  . Smoking status: Never Smoker   . Smokeless tobacco: Never Used  . Alcohol Use: Yes     0-1 per week.  . Drug Use: No  . Sexually Active: Yes -- Female partner(s)   Other Topics Concern  . Not on file   Social History Narrative   hospitalist at [employer]  Lives with female partner, dog and cat    Family History  Problem Relation Age of Onset  . Hyperlipidemia Mother   . Heart disease Mother   . Breast cancer Mother 67  . Hypertension Mother   . Alcohol abuse Mother   . Asthma Sister   . Hyperlipidemia Maternal Grandmother   . Vision loss Maternal Grandmother     temporal arteritis  . Diabetes Maternal  Grandmother     from steroids for TA  . COPD Maternal Grandfather   . Bipolar disorder Sister     schizoaffective bipolar  . Asthma Sister   . Cancer Maternal Uncle     colon cancer in 50's    Current outpatient prescriptions:Bepotastine Besilate (BEPREVE) 1.5 % SOLN, Place 1 drop into both eyes as needed., Disp: , Rfl: ;  Calcium Carb-Cholecalciferol (CALCIUM 1000 + D PO), Take 1 tablet by mouth daily. Vitamin D 1,000IU, Disp: , Rfl: ;  Cholecalciferol (VITAMIN D) 2000 UNITS tablet, Take 4,000 Units by mouth daily., Disp: , Rfl:  cycloSPORINE (RESTASIS) 0.05 % ophthalmic emulsion, Place 1 drop into both eyes 2 (two) times daily., Disp: , Rfl: ;  doxycycline (ADOXA) 50 MG tablet, Take 50 mg by mouth daily., Disp: , Rfl: ;  ibuprofen (ADVIL,MOTRIN) 600 MG tablet, Take 600 mg by mouth every 8 (eight) hours as needed. For pain , Disp: ,  Rfl: ;  levothyroxine (SYNTHROID, LEVOTHROID) 100 MCG tablet, Take 1 tablet (100 mcg total) by mouth daily., Disp: 90 tablet, Rfl: 3 Omega-3 Fatty Acids (FISH OIL) 1000 MG CAPS, Take 2,000 mg by mouth daily. , Disp: , Rfl: ;  tamoxifen (NOLVADEX) 20 MG tablet, Take 1 tablet (20 mg total) by mouth daily., Disp: 90 tablet, Rfl: 3  Allergies  Allergen Reactions  . Erythromycin     REACTION: GI Intolerance  . Penicillins     REACTION: steven's johnson syndrome  . Succinylcholine Other (See Comments)    Severe myalgias  . Sulfonamide Derivatives     REACTION: rash   ROS:  The patient denies anorexia, fever, weight changes, headaches,  vision changes, decreased hearing, ear pain, sore throat, breast concerns, chest pain, palpitations, dizziness, syncope, dyspnea on exertion, cough, swelling, nausea, vomiting, diarrhea, constipation (only when thyroid was underactive), abdominal pain, melena, hematochezia, indigestion/heartburn, hematuria, incontinence, dysuria, vaginal discharge, odor or itch, genital lesions, joint pains, weakness, tremor, suspicious skin lesions, depression, anxiety, abnormal bleeding/bruising, or enlarged lymph nodes. Tingling in feet if on bike too long Rare hemorrhoid flare Light sleeper--rare insomnia, responds to [place] (once or twice a month)  PHYSICAL EXAM: BP 110/72  Pulse 76  Ht 5\' 4"  (1.626 m)  Wt 142 lb (64.411 kg)  BMI 24.37 kg/m2  General Appearance:    Alert, cooperative, no distress, appears stated age  Head:    Normocephalic, without obvious abnormality, atraumatic  Eyes:    PERRL, conjunctiva/corneas clear, EOM's intact, fundi    benign  Ears:    Normal TM's and external ear canals  Nose:   Nares normal, mucosa normal, no drainage or sinus   tenderness  Throat:   Lips, mucosa, and tongue normal; teeth and gums normal  Neck:   Supple, no lymphadenopathy;  thyroid:  no   enlargement/tenderness/nodules; no carotid   bruit or JVD  Back:    Spine nontender,  no curvature, ROM normal, no CVA     tenderness  Lungs:     Clear to auscultation bilaterally without wheezes, rales or     ronchi; respirations unlabored  Chest Wall:    No tenderness or deformity   Heart:    Regular rate and rhythm, S1 and S2 normal, no murmur, rub   or gallop  Breast Exam:    Deferred to GYN  Abdomen:     Soft, non-tender, nondistended, normoactive bowel sounds,    no masses, no hepatosplenomegaly  Genitalia:    Deferred to GYN  Extremities:   No clubbing, cyanosis or edema  Pulses:   2+ and symmetric all extremities  Skin:   Skin color, texture, turgor normal, no rashes or lesions  Lymph nodes:   Cervical, supraclavicular, and axillary nodes normal  Neurologic:   CNII-XII intact, normal strength, sensation and gait; reflexes 2+ and symmetric throughout          Psych:   Normal mood, affect, hygiene and grooming.     ASSESSMENT/PLAN:  1. Routine general medical examination at a health care facility    2. Need for Tdap vaccination  Tdap vaccine greater than or equal to 7yo IM  3. Unspecified hypothyroidism  levothyroxine (SYNTHROID, LEVOTHROID) 100 MCG tablet    Discussed monthly self breast exams and yearly mammograms; at least 30 minutes of aerobic activity at least 5 days/week; proper sunscreen use reviewed; healthy diet, including goals of calcium and vitamin D intake and alcohol recommendations (less than or equal to 1 drink/day) reviewed; regular seatbelt use; changing batteries in smoke detectors.  Immunization recommendations discussed--Tdap given.  Colonoscopy recommendations reviewed, age 76.

## 2012-02-19 ENCOUNTER — Encounter: Payer: Self-pay | Admitting: Family Medicine

## 2012-05-29 ENCOUNTER — Ambulatory Visit (INDEPENDENT_AMBULATORY_CARE_PROVIDER_SITE_OTHER): Payer: 59 | Admitting: Internal Medicine

## 2012-05-29 DIAGNOSIS — Z23 Encounter for immunization: Secondary | ICD-10-CM

## 2012-05-29 DIAGNOSIS — Z789 Other specified health status: Secondary | ICD-10-CM

## 2012-05-29 MED ORDER — ATOVAQUONE-PROGUANIL HCL 250-100 MG PO TABS: 1.0000 | ORAL_TABLET | Freq: Every day | ORAL | Status: DC

## 2012-05-29 MED ORDER — TYPHOID VI POLYSACCHARIDE VACC 25 MCG/0.5ML IM SOLN: 0.5000 mL | Freq: Once | INTRAMUSCULAR | Status: DC

## 2012-05-29 MED ORDER — CIPROFLOXACIN HCL 500 MG PO TABS: 500.0000 mg | ORAL_TABLET | Freq: Two times a day (BID) | ORAL | Status: DC

## 2012-06-02 NOTE — Progress Notes (Signed)
[facility]  RFV: pre-travel counseling and vaccines for [place] Subjective:    Patient ID: Amanda Jensen, female    DOB: May 18, 1966, 46 y.o.   MRN: [id number]  HPI 46 yo F with hx breast ca s/p lumpectomy and radiation in march 2010, now on tamoxifen will be taking a 10 day trip to [place] in January 2014 that will include hiking [place] and [place] trail in addition to 2 days in [place].  Allergies  Allergen Reactions  . Erythromycin     REACTION: GI Intolerance  . Penicillins     REACTION: steven's johnson syndrome  . Succinylcholine Other (See Comments)    Severe myalgias  . Sulfonamide Derivatives     REACTION: rash    Current Outpatient Prescriptions on File Prior to Visit  Medication Sig Dispense Refill  . Bepotastine Besilate (BEPREVE) 1.5 % SOLN Place 1 drop into both eyes as needed.      . Calcium Carb-Cholecalciferol (CALCIUM 1000 + D PO) Take 1 tablet by mouth daily. Vitamin D 1,000IU      . Cholecalciferol (VITAMIN D) 2000 UNITS tablet Take 4,000 Units by mouth daily.      . cycloSPORINE (RESTASIS) 0.05 % ophthalmic emulsion Place 1 drop into both eyes 2 (two) times daily.      Marland Kitchen doxycycline (ADOXA) 50 MG tablet Take 50 mg by mouth daily.      Marland Kitchen ibuprofen (ADVIL,MOTRIN) 600 MG tablet Take 600 mg by mouth every 8 (eight) hours as needed. For pain       . levothyroxine (SYNTHROID, LEVOTHROID) 100 MCG tablet Take 1 tablet (100 mcg total) by mouth daily.  90 tablet  3  . Omega-3 Fatty Acids (FISH OIL) 1000 MG CAPS Take 2,000 mg by mouth daily.       . tamoxifen (NOLVADEX) 20 MG tablet Take 1 tablet (20 mg total) by mouth daily.  90 tablet  3   No current facility-administered medications on file prior to visit.    Active Ambulatory Problems    Diagnosis Date Noted  . BREAST CANCER 04/13/2009  . HYPOTHYROIDISM 04/13/2009  . HEMORRHOIDS 04/07/2009  . CHRONIC RHINITIS 04/07/2009  . ASTHMA 04/13/2009  . Fibroma 12/02/2011  . Bilateral foot pain 12/02/2011    Resolved Ambulatory Problems    Diagnosis Date Noted  . ANEMIA 04/13/2009  . DEPRESSION 04/07/2009  . CONSTIPATION 04/07/2009  . CELLULITIS/ABSCESS, FINGER NOS 10/24/2006  . FOOT PAIN, LEFT 05/13/2008  . Abdominal pain, epigastric 09/26/2010   Past Medical History  Diagnosis Date  . Complication of anesthesia   . Cancer   . Headache   . Sinus infection   . Allergy history unknown   . Constipation   . Depression   . Hypothyroidism   . Rosacea   . Dry eyes      Previous travel hx: [place], [place], [place], [address]  Does not suffer from altitude sickness nor motion sickness; occ insomnia  Social HX: works FT as a Scientist, product/process development,  History  Substance Use Topics  . Smoking status: Never Smoker   . Smokeless tobacco: Never Used  . Alcohol Use: Yes     0-1 per week.   Family History  Problem Relation Age of Onset  . Hyperlipidemia Mother   . Heart disease Mother   . Breast cancer Mother 20  . Hypertension Mother   . Alcohol abuse Mother   . Asthma Sister   . Hyperlipidemia Maternal Grandmother   . Vision loss Maternal Grandmother  temporal arteritis  . Diabetes Maternal Grandmother     from steroids for TA  . COPD Maternal Grandfather   . Bipolar disorder Sister     schizoaffective bipolar  . Asthma Sister   . Cancer Maternal Uncle     colon cancer in 50's     Review of Systems     Objective:   Physical Exam        Assessment & Plan:  Pre-travel counseling: we discussed malaria prevention with use of premethrin and deet spray  Traveller's diarrhea= gave rx for cipro, and instructions how to use incase of TD. Also recommended pepto bismal; gave handout sheet  Malaria proph = gave malarone rx to cover for the few days of possible malaria exposure in the [place]. Instructed to start 2 days prior to going into area until 7 days after.  Pre-travel vaccines = gave typhoid injection and yellow fever vax. Gave precautions of  side effects of expect. Recommended to get flu vaccination prior to leaving for trip, which she can get through her employer.  Insomnia/jet lag= will give rx for ambien.

## 2012-06-29 ENCOUNTER — Ambulatory Visit (HOSPITAL_BASED_OUTPATIENT_CLINIC_OR_DEPARTMENT_OTHER): Payer: 59 | Admitting: Oncology

## 2012-06-29 ENCOUNTER — Other Ambulatory Visit (HOSPITAL_BASED_OUTPATIENT_CLINIC_OR_DEPARTMENT_OTHER): Payer: 59 | Admitting: Lab

## 2012-06-29 VITALS — BP 97/63 | HR 84 | Temp 99.0°F | Resp 20 | Ht 64.0 in | Wt 140.0 lb

## 2012-06-29 DIAGNOSIS — C50919 Malignant neoplasm of unspecified site of unspecified female breast: Secondary | ICD-10-CM

## 2012-06-29 DIAGNOSIS — Z170 Estrogen receptor positive status [ER+]: Secondary | ICD-10-CM

## 2012-06-29 DIAGNOSIS — E039 Hypothyroidism, unspecified: Secondary | ICD-10-CM

## 2012-06-29 DIAGNOSIS — D0590 Unspecified type of carcinoma in situ of unspecified breast: Secondary | ICD-10-CM

## 2012-06-29 DIAGNOSIS — J45990 Exercise induced bronchospasm: Secondary | ICD-10-CM

## 2012-06-29 LAB — COMPREHENSIVE METABOLIC PANEL (CC13)
ALT: 13 U/L (ref 0–55)
CO2: 26 mEq/L (ref 22–29)
Calcium: 9.3 mg/dL (ref 8.4–10.4)
Chloride: 109 mEq/L — ABNORMAL HIGH (ref 98–107)
Sodium: 142 mEq/L (ref 136–145)
Total Protein: 6.5 g/dL (ref 6.4–8.3)

## 2012-06-29 LAB — CBC WITH DIFFERENTIAL/PLATELET
Eosinophils Absolute: 0.1 10*3/uL (ref 0.0–0.5)
MONO#: 0.4 10*3/uL (ref 0.1–0.9)
NEUT#: 4 10*3/uL (ref 1.5–6.5)
Platelets: 225 10*3/uL (ref 145–400)
RBC: 3.82 10*6/uL (ref 3.70–5.45)
RDW: 12.4 % (ref 11.2–14.5)
WBC: 7.6 10*3/uL (ref 3.9–10.3)

## 2012-06-29 NOTE — Progress Notes (Signed)
Hematology and Oncology Follow Up Visit  Amanda Jensen [id number] 1966/06/01 46 y.o. 06/29/2012 7:34 PM   Principle Diagnosis: Encounter Diagnosis  Name Primary?  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 Stereotactic biopsy done 10/17/2008 showed ductal carcinoma in situ in a sclerosing papilloma. Tissue tested 100% positive for both ER and PR. A 7 x 5 x 8 mm nodule in the right breast was biopsied and consistent with a fibroadenoma. She underwent a lumpectomy on 11/09/2008. The area of DCIS was 0.6 cm. She received postoperative radiation. She was premenopausal at the time. She was started on tamoxifen hormonal therapy and remains on tamoxifen at this time.  She underwent a hysterectomy with unilateral left oophorectomy on 02/27/2011 for menorrhagia and pelvic discomfort related to uterine fibroids. She had had 2 previous ablation procedures for menorrhagia. She's never been pregnant.  Overall she is doing well. She's had no interim medical problems. She does complain of insomnia. She denies any headache or change in vision, no bone pain, no vaginal bleeding.  She reminded me today that her father is receiving hormonal therapy for prostate cancer. Her mother had breast cancer but died at an early age of a heart attack. (58).    Medications: reviewed  Allergies:  Allergies  Allergen Reactions  . Erythromycin     REACTION: GI Intolerance  . Penicillins     REACTION: steven's johnson syndrome  . Succinylcholine Other (See Comments)    Severe myalgias  . Sulfonamide Derivatives     REACTION: rash    Review of Systems: Constitutional:   Work related fatigue Respiratory: No cough or dyspnea Cardiovascular:  No chest pain  or palpitations Gastrointestinal: No change in bowel habit Genito-Urinary: See above Musculoskeletal: See above Neurologic: See above Skin: No rash Remaining ROS negative.  Physical Exam: Blood pressure 97/63, pulse 84, temperature 99 F (37.2 C), temperature source Oral, resp. rate 20, height 5\' 4"  (1.626 m), weight 140 lb (63.504 kg). Wt Readings from Last 3 Encounters:  06/29/12 140 lb (63.504 kg)  02/17/12 142 lb (64.411 kg)  02/04/12 140 lb (63.504 kg)     General appearance: Well-nourished Caucasian woman HENNT: Pharynx erythema or exudate Lymph nodes: No cervical, supraclavicular, or axillary adenopathy Breasts: Surgical changes left breast, radiation tattoos, no dominant mass in either breast, dense scar and area of previous lumpectomy. Lungs: Clear to auscultation resonant to percussion Heart: Regular rhythm no murmur Abdomen: Soft, nontender, no mass, no organomegaly Extremities: No edema, no calf tenderness Vascular: No cyanosis, Neurologic: Motor strength 5 over 5, reflexes 1+ symmetric Skin: No rash or ecchymosis  Lab Results: Lab Results  Component Value Date   WBC 7.6 06/29/2012   HGB 11.9 06/29/2012   HCT 35.2 06/29/2012   MCV 92.1 06/29/2012   PLT 225 06/29/2012     Chemistry      Component Value Date/Time   NA 142 06/29/2012 1408   NA 140 07/16/2010 1619   K 3.8 06/29/2012 1408   K 3.9 07/16/2010 1619   CL 109* 06/29/2012 1408   CL 108 07/16/2010 1619   CO2 26 06/29/2012 1408   CO2 26 07/16/2010 1619   BUN 22.0 06/29/2012 1408   BUN 16 07/16/2010 1619   CREATININE  0.9 06/29/2012 1408   CREATININE 1.26* 07/16/2010 1619      Component Value Date/Time   CALCIUM 9.3 06/29/2012 1408   CALCIUM 9.1 07/16/2010 1619   ALKPHOS 24* 06/29/2012 1408   ALKPHOS 21* 07/16/2010 1619   AST 17 06/29/2012 1408   AST 24 07/16/2010 1619   ALT 13 06/29/2012 1408   ALT 16 07/16/2010 1619   BILITOT 0.22 06/29/2012 1408   BILITOT 0.4 07/16/2010 1619        Radiological Studies: Mammogram due in April 2014   Impression and Plan: #1. Ductal carcinoma in situ left breast We reviewed some new data with respect to duration of tamoxifen. There does appear to be a slight improvement in progression free survival with extended tamoxifen use up to 10 years. I need to go back and review this data. I don't believe this applies to noninvasive cancer. She is at very low risk for recurrence at 5 years of hormonal therapy may be sufficient.  #2. Status post hysterectomy with unilateral oophorectomy for fibroid uterus  #3. Hypothyroid on replacement  #4. Exercise-induced asthma   CC:. Dr. Joselyn Arrow; Dr. Carrington Clamp; Dr. Cicero Duck; Dr. Betsey Holiday, MD 11/11/20137:34 PM

## 2012-06-30 ENCOUNTER — Telehealth: Payer: Self-pay | Admitting: Oncology

## 2012-06-30 LAB — VITAMIN D 25 HYDROXY (VIT D DEFICIENCY, FRACTURES): Vit D, 25-Hydroxy: 71 ng/mL (ref 30–89)

## 2012-06-30 NOTE — Telephone Encounter (Signed)
Talked to patient gave her appt for November 20114 MD only and pt will set up own mammogram

## 2012-12-07 ENCOUNTER — Encounter: Payer: Self-pay | Admitting: *Deleted

## 2012-12-16 ENCOUNTER — Ambulatory Visit
Admission: RE | Admit: 2012-12-16 | Discharge: 2012-12-16 | Disposition: A | Discharge status: Home / Self Care | Payer: 59 | Source: Ambulatory Visit | Attending: Oncology | Admitting: Oncology

## 2012-12-16 ENCOUNTER — Other Ambulatory Visit: Payer: Self-pay | Admitting: Oncology

## 2012-12-16 DIAGNOSIS — C50919 Malignant neoplasm of unspecified site of unspecified female breast: Secondary | ICD-10-CM

## 2012-12-21 ENCOUNTER — Other Ambulatory Visit: Payer: Self-pay | Admitting: *Deleted

## 2012-12-21 DIAGNOSIS — C50919 Malignant neoplasm of unspecified site of unspecified female breast: Secondary | ICD-10-CM

## 2012-12-21 MED ORDER — TAMOXIFEN CITRATE 20 MG PO TABS: 20.0000 mg | ORAL_TABLET | Freq: Every day | ORAL | Status: DC

## 2013-01-15 ENCOUNTER — Other Ambulatory Visit: Payer: Self-pay | Admitting: Obstetrics and Gynecology

## 2013-06-22 ENCOUNTER — Ambulatory Visit (HOSPITAL_BASED_OUTPATIENT_CLINIC_OR_DEPARTMENT_OTHER): Payer: Self-pay | Admitting: Oncology

## 2013-06-22 DIAGNOSIS — Z170 Estrogen receptor positive status [ER+]: Secondary | ICD-10-CM

## 2013-06-22 DIAGNOSIS — D0590 Unspecified type of carcinoma in situ of unspecified breast: Secondary | ICD-10-CM

## 2013-06-22 DIAGNOSIS — R232 Flushing: Secondary | ICD-10-CM

## 2013-06-23 ENCOUNTER — Encounter: Payer: Self-pay | Admitting: Oncology

## 2013-06-23 DIAGNOSIS — T451X5A Adverse effect of antineoplastic and immunosuppressive drugs, initial encounter: Secondary | ICD-10-CM | POA: Insufficient documentation

## 2013-06-23 DIAGNOSIS — R232 Flushing: Secondary | ICD-10-CM

## 2013-06-23 HISTORY — DX: Flushing: R23.2

## 2013-06-23 HISTORY — DX: Flushing: T45.1X5A

## 2013-06-23 MED ORDER — TAMOXIFEN CITRATE 20 MG PO TABS: 20.0000 mg | ORAL_TABLET | Freq: Every day | ORAL | Status: DC

## 2013-06-23 MED ORDER — VENLAFAXINE HCL ER 37.5 MG PO CP24: 37.5000 mg | ORAL_CAPSULE | Freq: Every day | ORAL | Status: DC

## 2013-06-23 NOTE — Progress Notes (Signed)
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 Stereotactic biopsy done 10/17/2008 showed ductal carcinoma in situ in a sclerosing papilloma. Tissue tested 100% positive for both ER and PR. A 7 x 5 x 8 mm nodule in the right breast was biopsied and consistent with a fibroadenoma. She underwent a lumpectomy on 11/09/2008. The area of DCIS was 0.6 cm. She received postoperative radiation. She was premenopausal at the time. She was started on tamoxifen hormonal therapy and remains on tamoxifen at this time.  She underwent a hysterectomy with unilateral left oophorectomy on 02/27/2011 for menorrhagia and pelvic discomfort related to uterine fibroids. She had had 2 previous ablation procedures for menorrhagia.  She's never been pregnant.  Over the last few month she has started to develop significant uncomfortable hot flashes worse at night.  On exam: Lungs are clear Regular cardiac rhythm no murmur Increased glandular tissue left breast more pronounced upper outer quadrant no dominant mass. Radiation tattoos. Abdomen soft nontender no mass no organomegaly Extremities no edema no calf tenderness  Mammograms: 12/16/2012: Stable post lumpectomy changes outer and mid upper left breast. Breast ultrasound same date: No focal abnormality.   #1. Ductal carcinoma in situ left breast  At our last visit we discussed some new data suggesting that 10 years of tamoxifen was superior to 5. I reevaluated these data.   #2. Status post hysterectomy with unilateral oophorectomy for fibroid uterus   #3. Hypothyroid on replacement   #4. Exercise-induced asthma  #5. Hot flashes I'm going to prescribe Effexor extended release 37.5 mg daily

## 2013-09-08 ENCOUNTER — Other Ambulatory Visit: Payer: Self-pay | Admitting: Oncology

## 2013-10-18 ENCOUNTER — Encounter: Payer: Self-pay | Admitting: Oncology

## 2013-11-18 ENCOUNTER — Other Ambulatory Visit: Payer: Self-pay | Admitting: Oncology

## 2013-11-18 DIAGNOSIS — Z853 Personal history of malignant neoplasm of breast: Secondary | ICD-10-CM

## 2013-12-21 ENCOUNTER — Ambulatory Visit
Admission: RE | Admit: 2013-12-21 | Discharge: 2013-12-21 | Disposition: A | Discharge status: Home / Self Care | Payer: 59 | Source: Ambulatory Visit | Attending: Oncology | Admitting: Oncology

## 2013-12-21 ENCOUNTER — Encounter (INDEPENDENT_AMBULATORY_CARE_PROVIDER_SITE_OTHER): Payer: Self-pay

## 2013-12-21 DIAGNOSIS — Z853 Personal history of malignant neoplasm of breast: Secondary | ICD-10-CM

## 2014-01-25 ENCOUNTER — Other Ambulatory Visit: Payer: Self-pay | Admitting: Nephrology

## 2014-03-06 ENCOUNTER — Ambulatory Visit (HOSPITAL_COMMUNITY)
Admission: RE | Admit: 2014-03-06 | Discharge: 2014-03-06 | Disposition: A | Discharge status: Home / Self Care | Payer: 59 | Source: Ambulatory Visit | Attending: Internal Medicine | Admitting: Internal Medicine

## 2014-03-06 ENCOUNTER — Other Ambulatory Visit: Payer: Self-pay | Admitting: Internal Medicine

## 2014-03-06 DIAGNOSIS — R609 Edema, unspecified: Secondary | ICD-10-CM | POA: Insufficient documentation

## 2014-03-06 LAB — COMPREHENSIVE METABOLIC PANEL
ALK PHOS: 26 U/L — AB (ref 39–117)
ALT: 19 U/L (ref 0–35)
ANION GAP: 13 (ref 5–15)
AST: 19 U/L (ref 0–37)
Albumin: 3.7 g/dL (ref 3.5–5.2)
BUN: 24 mg/dL — AB (ref 6–23)
CO2: 25 meq/L (ref 19–32)
Calcium: 9.5 mg/dL (ref 8.4–10.5)
Chloride: 104 mEq/L (ref 96–112)
Creatinine, Ser: 0.94 mg/dL (ref 0.50–1.10)
GFR, EST AFRICAN AMERICAN: 82 mL/min — AB (ref >90)
GFR, EST NON AFRICAN AMERICAN: 71 mL/min — AB (ref >90)
GLUCOSE: 110 mg/dL — AB (ref 70–99)
Potassium: 4 mEq/L (ref 3.7–5.3)
Sodium: 142 mEq/L (ref 137–147)
Total Bilirubin: <0.2 mg/dL — ABNORMAL LOW (ref 0.3–1.2)
Total Protein: 7 g/dL (ref 6.0–8.3)

## 2014-03-06 LAB — PRO B NATRIURETIC PEPTIDE: Pro B Natriuretic peptide (BNP): 65.2 pg/mL (ref 0–125)

## 2014-03-06 LAB — MAGNESIUM: Magnesium: 2.2 mg/dL (ref 1.5–2.5)

## 2014-05-05 ENCOUNTER — Other Ambulatory Visit: Payer: Self-pay | Admitting: Oncology

## 2014-11-08 ENCOUNTER — Other Ambulatory Visit: Payer: Self-pay | Admitting: Radiation Oncology

## 2014-11-08 ENCOUNTER — Other Ambulatory Visit: Payer: Self-pay | Admitting: Oncology

## 2014-11-08 DIAGNOSIS — Z853 Personal history of malignant neoplasm of breast: Secondary | ICD-10-CM

## 2014-12-05 ENCOUNTER — Other Ambulatory Visit: Payer: Self-pay | Admitting: Oncology

## 2014-12-05 MED ORDER — ESCITALOPRAM OXALATE 10 MG PO TABS: 10.0000 mg | ORAL_TABLET | Freq: Every day | ORAL | Status: DC

## 2014-12-28 ENCOUNTER — Ambulatory Visit
Admission: RE | Admit: 2014-12-28 | Discharge: 2014-12-28 | Disposition: A | Discharge status: Home / Self Care | Payer: 59 | Source: Ambulatory Visit | Attending: Radiation Oncology | Admitting: Radiation Oncology

## 2014-12-28 DIAGNOSIS — Z853 Personal history of malignant neoplasm of breast: Secondary | ICD-10-CM

## 2015-03-06 ENCOUNTER — Encounter: Payer: Self-pay | Admitting: Genetic Counselor

## 2015-04-25 ENCOUNTER — Encounter: Payer: Self-pay | Admitting: Family Medicine

## 2015-04-25 ENCOUNTER — Ambulatory Visit (INDEPENDENT_AMBULATORY_CARE_PROVIDER_SITE_OTHER): Payer: 59 | Admitting: Family Medicine

## 2015-04-25 VITALS — BP 100/70 | HR 60 | Wt 136.6 lb

## 2015-04-25 DIAGNOSIS — J310 Chronic rhinitis: Secondary | ICD-10-CM | POA: Insufficient documentation

## 2015-04-25 DIAGNOSIS — R430 Anosmia: Secondary | ICD-10-CM | POA: Diagnosis not present

## 2015-04-25 MED ORDER — PREDNISONE 10 MG (48) PO TBPK: ORAL_TABLET | Freq: Every day | ORAL | Status: DC

## 2015-04-25 NOTE — Progress Notes (Signed)
   Subjective:    Patient ID: Amanda Jensen, female    DOB: 01/18/1966, 49 y.o.   MRN: [id number]  HPI She is here for consult post viral anosmia. Was seen by ENT and Adrian Blackwater then did have an endoscopy done which showed normal mucosa. They elected to put her on a 5 day course of 40 mg of prednisone. She does now think that it has helped slightly with her sense of smell. She would like to be considered for a longer dosing schedule. She has had no difficulty with steroid causing either CNS or other symptoms. She does note some slight swelling. Does have a previous history of sinus surgery. She also has a history of gustatory rhinitis. Another concern is that in the past she did have difficulty with reflux disease and initially was placed on Prilosec and Propulsid. The Propulsid was stopped. Since then she has not been taking Prilosec but recently has noted difficulty with pickup type symptoms that she is blaming on her reflux. Also of note in the past when she was placed on clarithromycin her GI symptoms did clear up entirely. She was never checked for H. Pylori.   Review of Systems     Objective:   Physical Exam Alert and in no distress otherwise not examined       Assessment & Plan:  Anosmia  Gustatory rhinitis After discussion with her, we have decided to treat her with A 12 day double strength Dosepak to see if this will further help with her and now is me. She will also discussed possibly getting a CLOtest done when she sees her gastroenterologist. She is scheduled for colonoscopy and endoscopy. We also discussed possibility of using clarithromycin but will use this as a later date if needed.

## 2015-04-26 ENCOUNTER — Other Ambulatory Visit (HOSPITAL_COMMUNITY): Payer: Self-pay | Admitting: Otolaryngology

## 2015-04-26 DIAGNOSIS — R438 Other disturbances of smell and taste: Secondary | ICD-10-CM

## 2015-04-28 ENCOUNTER — Ambulatory Visit (HOSPITAL_COMMUNITY)
Admission: RE | Admit: 2015-04-28 | Discharge: 2015-04-28 | Disposition: A | Discharge status: Home / Self Care | Payer: 59 | Source: Ambulatory Visit | Attending: Otolaryngology | Admitting: Otolaryngology

## 2015-04-28 ENCOUNTER — Encounter (HOSPITAL_COMMUNITY): Payer: Self-pay

## 2015-04-28 DIAGNOSIS — R438 Other disturbances of smell and taste: Secondary | ICD-10-CM | POA: Insufficient documentation

## 2015-05-19 LAB — HM COLONOSCOPY

## 2015-05-29 ENCOUNTER — Other Ambulatory Visit: Payer: Self-pay | Admitting: Gastroenterology

## 2015-05-29 DIAGNOSIS — R1310 Dysphagia, unspecified: Secondary | ICD-10-CM

## 2015-06-05 ENCOUNTER — Ambulatory Visit (HOSPITAL_COMMUNITY)
Admission: RE | Admit: 2015-06-05 | Discharge: 2015-06-05 | Disposition: A | Discharge status: Home / Self Care | Payer: 59 | Source: Ambulatory Visit | Attending: Gastroenterology | Admitting: Gastroenterology

## 2015-06-05 DIAGNOSIS — K224 Dyskinesia of esophagus: Secondary | ICD-10-CM | POA: Diagnosis not present

## 2015-06-05 DIAGNOSIS — R1310 Dysphagia, unspecified: Secondary | ICD-10-CM | POA: Insufficient documentation

## 2015-08-23 MED FILL — LEVOTHYROXINE 88 MCG TABLET: 88 | 90 days supply | Qty: 90 | Fill #0

## 2015-09-20 DIAGNOSIS — R438 Other disturbances of smell and taste: Secondary | ICD-10-CM | POA: Diagnosis not present

## 2015-09-20 DIAGNOSIS — G520 Disorders of olfactory nerve: Secondary | ICD-10-CM | POA: Diagnosis not present

## 2015-09-21 ENCOUNTER — Other Ambulatory Visit (HOSPITAL_COMMUNITY)
Admission: RE | Admit: 2015-09-21 | Discharge: 2015-09-21 | Disposition: A | Discharge status: Home / Self Care | Payer: 59 | Source: Ambulatory Visit | Attending: Otolaryngology | Admitting: Otolaryngology

## 2015-09-21 DIAGNOSIS — Z7689 Persons encountering health services in other specified circumstances: Secondary | ICD-10-CM | POA: Diagnosis not present

## 2015-09-25 MED FILL — ESCITALOPRAM 10 MG TABLET: 10 | 90 days supply | Qty: 90 | Fill #3

## 2015-09-25 MED FILL — ZETONNA 37 MCG NASAL SPRAY: 37 | 90 days supply | Qty: 18 | Fill #0

## 2015-10-30 MED FILL — traZODone HCL 50 MG TABS: 50 | 90 days supply | Qty: 90 | Fill #3

## 2015-11-24 MED FILL — LEVOTHYROXINE 88 MCG TABLET: 88 | 90 days supply | Qty: 90 | Fill #1

## 2015-11-27 ENCOUNTER — Other Ambulatory Visit: Payer: Self-pay

## 2015-11-27 DIAGNOSIS — Z1231 Encounter for screening mammogram for malignant neoplasm of breast: Secondary | ICD-10-CM

## 2015-12-27 MED FILL — ESCITALOPRAM 10 MG TABLET: 10 | 90 days supply | Qty: 90 | Fill #0

## 2016-01-10 ENCOUNTER — Ambulatory Visit
Admission: RE | Admit: 2016-01-10 | Discharge: 2016-01-10 | Disposition: A | Discharge status: Home / Self Care | Payer: 59 | Source: Ambulatory Visit

## 2016-01-10 DIAGNOSIS — R438 Other disturbances of smell and taste: Secondary | ICD-10-CM | POA: Diagnosis not present

## 2016-01-10 DIAGNOSIS — Z1231 Encounter for screening mammogram for malignant neoplasm of breast: Secondary | ICD-10-CM | POA: Diagnosis not present

## 2016-01-10 DIAGNOSIS — G520 Disorders of olfactory nerve: Secondary | ICD-10-CM | POA: Diagnosis not present

## 2016-01-17 ENCOUNTER — Telehealth: Payer: Self-pay | Admitting: *Deleted

## 2016-01-17 ENCOUNTER — Other Ambulatory Visit: Payer: Self-pay | Admitting: Cardiology

## 2016-01-17 DIAGNOSIS — R0600 Dyspnea, unspecified: Secondary | ICD-10-CM

## 2016-01-17 DIAGNOSIS — E785 Hyperlipidemia, unspecified: Secondary | ICD-10-CM

## 2016-01-17 NOTE — Telephone Encounter (Signed)
Order placed. msg send to admin pool requesting help on study scheduling.

## 2016-01-17 NOTE — Telephone Encounter (Signed)
Contacted Stacy in [facility] room, she will contact patient to notify and can put her on schedule. msg sent to Dr. Percival Spanish for MD signature on order.

## 2016-01-17 NOTE — Telephone Encounter (Deleted)
-----   Message from Minus Breeding, MD sent at 01/17/2016  8:57 AM EDT ----- Please order a coronary calcium score for Dr. Rockne Menghini.  You can use dyspnea as a code.  She knows that she will pay out of pocket.  Please call her to let her know the order is in and how to get the study.  Thanks.

## 2016-01-17 NOTE — Telephone Encounter (Signed)
-----   Message from Minus Breeding, MD sent at 01/17/2016  8:57 AM EDT ----- Please order a coronary calcium score for Dr. Rockne Menghini.  You can use dyspnea as a code.  She knows that she will pay out of pocket.  Please call her to let her know the order is in and how to get the study.  Thanks.

## 2016-01-19 ENCOUNTER — Telehealth: Payer: Self-pay | Admitting: Family Medicine

## 2016-01-19 ENCOUNTER — Ambulatory Visit (INDEPENDENT_AMBULATORY_CARE_PROVIDER_SITE_OTHER)
Admission: RE | Admit: 2016-01-19 | Discharge: 2016-01-19 | Disposition: A | Discharge status: Home / Self Care | Payer: Self-pay | Source: Ambulatory Visit | Attending: Cardiology | Admitting: Cardiology

## 2016-01-19 ENCOUNTER — Other Ambulatory Visit: Payer: Self-pay | Admitting: Cardiology

## 2016-01-19 DIAGNOSIS — E785 Hyperlipidemia, unspecified: Secondary | ICD-10-CM

## 2016-01-19 NOTE — Telephone Encounter (Signed)
Left message for pt to call. Ok to schedule complete cpe with JCL.

## 2016-01-23 DIAGNOSIS — Z7689 Persons encountering health services in other specified circumstances: Secondary | ICD-10-CM | POA: Diagnosis not present

## 2016-01-23 NOTE — Telephone Encounter (Signed)
Called pt back and appt was made

## 2016-01-25 ENCOUNTER — Telehealth: Payer: Self-pay | Admitting: Cardiology

## 2016-01-25 NOTE — Telephone Encounter (Signed)
Spoke with pt who stated Dr Johnsie Cancel called her with result of Ct scoring test

## 2016-01-25 NOTE — Telephone Encounter (Signed)
Follow-up     The pt is returning [place] phone call from today.

## 2016-01-29 MED FILL — LEVOTHYROXINE 88 MCG TABLET: 88 | 90 days supply | Qty: 90 | Fill #2

## 2016-01-30 MED FILL — traZODone HCL 50 MG TABS: 50 | 90 days supply | Qty: 90 | Fill #0

## 2016-03-07 ENCOUNTER — Encounter: Payer: Self-pay | Admitting: Family Medicine

## 2016-03-07 ENCOUNTER — Ambulatory Visit (INDEPENDENT_AMBULATORY_CARE_PROVIDER_SITE_OTHER): Payer: 59 | Admitting: Family Medicine

## 2016-03-07 VITALS — BP 112/60 | HR 60 | Ht 65.0 in | Wt 147.4 lb

## 2016-03-07 DIAGNOSIS — E039 Hypothyroidism, unspecified: Secondary | ICD-10-CM

## 2016-03-07 DIAGNOSIS — B070 Plantar wart: Secondary | ICD-10-CM | POA: Diagnosis not present

## 2016-03-07 DIAGNOSIS — Z0000 Encounter for general adult medical examination without abnormal findings: Secondary | ICD-10-CM

## 2016-03-07 DIAGNOSIS — F341 Dysthymic disorder: Secondary | ICD-10-CM

## 2016-03-07 DIAGNOSIS — R7301 Impaired fasting glucose: Secondary | ICD-10-CM | POA: Diagnosis not present

## 2016-03-07 DIAGNOSIS — Z853 Personal history of malignant neoplasm of breast: Secondary | ICD-10-CM | POA: Diagnosis not present

## 2016-03-07 DIAGNOSIS — Z8249 Family history of ischemic heart disease and other diseases of the circulatory system: Secondary | ICD-10-CM

## 2016-03-07 DIAGNOSIS — K649 Unspecified hemorrhoids: Secondary | ICD-10-CM | POA: Diagnosis not present

## 2016-03-07 DIAGNOSIS — J310 Chronic rhinitis: Secondary | ICD-10-CM | POA: Diagnosis not present

## 2016-03-07 LAB — TSH: TSH: 2.18 mIU/L

## 2016-03-07 MED ORDER — LEVOTHYROXINE SODIUM 88 MCG PO TABS: 88.0000 ug | ORAL_TABLET | Freq: Every day | ORAL | Status: DC

## 2016-03-07 MED ORDER — HYDROCORTISONE ACETATE 25 MG RE SUPP: 25.0000 mg | Freq: Two times a day (BID) | RECTAL | Status: DC

## 2016-03-07 MED ORDER — ESCITALOPRAM OXALATE 10 MG PO TABS: 10.0000 mg | ORAL_TABLET | Freq: Every day | ORAL | Status: DC

## 2016-03-07 MED ORDER — CICLESONIDE 37 MCG/ACT NA AERS: 1.0000 "application " | INHALATION_SPRAY | Freq: Every day | NASAL | Status: DC

## 2016-03-07 MED FILL — ANUCORT-HC 25 MG SUPPOSITOR: 25 | 6 days supply | Qty: 12 | Fill #0

## 2016-03-07 NOTE — Progress Notes (Signed)
Subjective:    Patient ID: Amanda Jensen, female    DOB: June 20, 1966, 50 y.o.   MRN: [id number]  HPI She is here for complete examination. She recently has had difficulty with sore throat and night sweats as well as intermittent fever but nothing since then. She does have a history of anosmia and is apparently using theophylline for this. Her ENT gave her this and apparently there is some benefit from it. She also has a previous history of glucose intolerance with fasting blood sugar 110 but a hemoglobin A1c of 5.6. Also recent blood work did show an LDL of 110. She does have a family history of heart disease with her father having a heart attack at under age 20. She did get a CT calcium analysis which was apparently normal. She does have underlying allergies as well as gustatory rhinitis. She is on a nasal spray for this which does seem to work quite well. She has a history of hypothyroidism and is on Synthroid. She had difficulty with hemorrhoids causing itching and would like medication for this. She has had a colonoscopy. She has a previous history of breast cancer but presently is on no medications although she does occasionally have hot flashes. She continues on Lexapro she is doing well on this. This does help with work-related stress and anxiety. She also has a history of depression when she was in her 59s. She relates having to deal with alcoholic parents and the psychological problems from that. She also complains of a lesion present on her right foot. Her work is going well. She is in a stable relationship. She did get married. Family and social history as well as health maintenance and immunizations were reviewed. She does get regular neck logic evaluation. Review of Systems  All other systems reviewed and are negative.      Objective:   Physical Exam BP 112/60 mmHg  Pulse 60  Ht 5\' 5"  (1.651 m)  Wt 147 lb 6.4 oz (66.86 kg)  BMI 24.53 kg/m2  General Appearance:    Alert,  cooperative, no distress, appears stated age  Head:    Normocephalic, without obvious abnormality, atraumatic  Eyes:    PERRL, conjunctiva/corneas clear, EOM's intact, fundi    benign  Ears:    Normal TM's and external ear canals  Nose:   Nares normal, mucosa normal, no drainage or sinus   tenderness  Throat:   Lips, mucosa, and tongue normal; teeth and gums normal  Neck:   Supple, no lymphadenopathy;  thyroid:  no   enlargement/tenderness/nodules; no carotid   bruit or JVD  Back:    Spine nontender, no curvature, ROM normal, no CVA     tenderness  Lungs:     Clear to auscultation bilaterally without wheezes, rales or     ronchi; respirations unlabored  Chest Wall:    No tenderness or deformity   Heart:    Regular rate and rhythm, S1 and S2 normal, no murmur, rub   or gallop  Breast Exam:    Deferred to GYN  Abdomen:     Soft, non-tender, nondistended, normoactive bowel sounds,    no masses, no hepatosplenomegaly  Genitalia:    Deferred to GYN     Extremities:   No clubbing, cyanosis or edema  Pulses:   2+ and symmetric all extremities  Skin:   Skin color, texture, turgor normal.Less than 0.5 cm lesion noted on the plantar surface of the right foot.   Lymph nodes:  Cervical, supraclavicular, and axillary nodes normal  Neurologic:   CNII-XII intact, normal strength, sensation and gait; reflexes 2+ and symmetric throughout          Psych:   Normal mood, affect, hygiene and grooming.          Assessment & Plan:  Routine general medical examination at a health care facility  Chronic rhinitis - Plan: Ciclesonide (ZETONNA) 51 MCG/ACT AERS  Hemorrhoids, unspecified hemorrhoid type - Plan: hydrocortisone (ANUSOL-HC) 25 MG suppository  Hypothyroidism, unspecified hypothyroidism type - Plan: TSH, levothyroxine (SYNTHROID, LEVOTHROID) 88 MCG tablet  Gustatory rhinitis  History of breast cancer in female  Family history of heart disease in female family member before age 38  Plantar  wart of right foot  Dysthymia - Plan: escitalopram (LEXAPRO) 10 MG tablet  Impaired fasting glucose She will continue on her present medication regimen. Instructed her on the proper use of Compound W for the lesion on her foot. She will return here if any problems. She will follow-up with GYN.

## 2016-04-08 MED FILL — ESCITALOPRAM 10 MG TABLET: 10 | 90 days supply | Qty: 90 | Fill #0 | Status: TO

## 2016-04-11 MED FILL — ZETONNA 37 MCG NASAL SPRAY: 37 | 90 days supply | Qty: 18 | Fill #0 | Status: TO

## 2016-05-01 MED FILL — traZODone HCL 50 MG TABS: 50 | 90 days supply | Qty: 90 | Fill #1

## 2016-05-06 DIAGNOSIS — H5213 Myopia, bilateral: Secondary | ICD-10-CM | POA: Diagnosis not present

## 2016-05-06 DIAGNOSIS — H524 Presbyopia: Secondary | ICD-10-CM | POA: Diagnosis not present

## 2016-05-06 DIAGNOSIS — H52223 Regular astigmatism, bilateral: Secondary | ICD-10-CM | POA: Diagnosis not present

## 2016-05-16 MED FILL — MAGIC MOUTHWASH BOP FORM: 14 days supply | Qty: 480 | Fill #0

## 2016-05-23 MED FILL — LEVOTHYROXINE 88 MCG TABLET: 88 | 90 days supply | Qty: 90 | Fill #3

## 2016-05-27 MED FILL — LEVOTHYROXINE 88 MCG TABLET: 88 | 90 days supply | Qty: 90 | Fill #0

## 2016-07-03 MED FILL — ESCITALOPRAM 10 MG TABLET: 10 | 90 days supply | Qty: 90 | Fill #0

## 2016-07-03 MED FILL — RESTASIS 0.05% EYE EMULSION: 0.05 | 90 days supply | Qty: 180 | Fill #0

## 2016-08-07 MED FILL — traZODone HCL 50 MG TABS: 50 | 90 days supply | Qty: 90 | Fill #2

## 2016-08-27 MED FILL — SYNTHROID 88 MCG TABLET: 88 | 90 days supply | Qty: 90 | Fill #1

## 2016-10-10 MED FILL — ESCITALOPRAM 10 MG TABLET: 10 | 90 days supply | Qty: 90 | Fill #1

## 2016-10-21 DIAGNOSIS — H1859 Other hereditary corneal dystrophies: Secondary | ICD-10-CM | POA: Diagnosis not present

## 2016-10-21 DIAGNOSIS — H01021 Squamous blepharitis right upper eyelid: Secondary | ICD-10-CM | POA: Diagnosis not present

## 2016-10-21 DIAGNOSIS — H16223 Keratoconjunctivitis sicca, not specified as Sjogren's, bilateral: Secondary | ICD-10-CM | POA: Diagnosis not present

## 2016-10-21 DIAGNOSIS — H01024 Squamous blepharitis left upper eyelid: Secondary | ICD-10-CM | POA: Diagnosis not present

## 2016-10-21 MED FILL — XIIDRA 5% EYE DROPS: 5 | 30 days supply | Qty: 60 | Fill #0

## 2016-10-31 MED FILL — traZODone HCL 50 MG TABS: 50 | 90 days supply | Qty: 90 | Fill #3

## 2016-11-28 DIAGNOSIS — H16223 Keratoconjunctivitis sicca, not specified as Sjogren's, bilateral: Secondary | ICD-10-CM | POA: Diagnosis not present

## 2016-11-28 DIAGNOSIS — H01024 Squamous blepharitis left upper eyelid: Secondary | ICD-10-CM | POA: Diagnosis not present

## 2016-11-28 DIAGNOSIS — H01021 Squamous blepharitis right upper eyelid: Secondary | ICD-10-CM | POA: Diagnosis not present

## 2016-11-29 MED FILL — LEVOTHYROXINE 88 MCG TABLET: 88 | 90 days supply | Qty: 90 | Fill #2

## 2016-12-13 ENCOUNTER — Ambulatory Visit (INDEPENDENT_AMBULATORY_CARE_PROVIDER_SITE_OTHER): Payer: 59 | Admitting: Internal Medicine

## 2016-12-13 DIAGNOSIS — Z789 Other specified health status: Secondary | ICD-10-CM | POA: Diagnosis not present

## 2016-12-13 DIAGNOSIS — Z9189 Other specified personal risk factors, not elsewhere classified: Secondary | ICD-10-CM

## 2016-12-13 DIAGNOSIS — Z23 Encounter for immunization: Secondary | ICD-10-CM

## 2016-12-13 DIAGNOSIS — Z7184 Encounter for health counseling related to travel: Secondary | ICD-10-CM

## 2016-12-13 DIAGNOSIS — Z7189 Other specified counseling: Secondary | ICD-10-CM

## 2016-12-13 MED ORDER — ATOVAQUONE-PROGUANIL HCL 250-100 MG PO TABS: 1.0000 | ORAL_TABLET | Freq: Every day | ORAL | 0 refills | Status: DC

## 2016-12-13 MED ORDER — CIPROFLOXACIN HCL 500 MG PO TABS: 500.0000 mg | ORAL_TABLET | Freq: Two times a day (BID) | ORAL | 0 refills | Status: DC

## 2016-12-13 MED FILL — CIPROFLOXACIN HCL 500 MG TA: 500 | 5 days supply | Qty: 10 | Fill #0

## 2016-12-13 MED FILL — ATOVAQUONE-PROGUANIL 250-10: 250-100 | 21 days supply | Qty: 21 | Fill #0

## 2016-12-13 NOTE — Progress Notes (Signed)
Subjective:   Elvy Mclarty Boike is a 51 y.o. female who presents to the Infectious Disease clinic for travel consultation. Planned departure date: may 19      Planned return date: may 31 Countries of travel: [place], [place] Areas in country: rural Accommodations: hotel Purpose of travel: vacation Prior travel out of [place]: yes: [place], [place], [place], [place], [place], [place], [place], [place], [place], [place]     Objective:   Medications:fish oil, synthroid, lexapro, calcium, magnesium, vit d, trazodone   all: penicillin, erythromycin, sulfa, succinylcholine  pmhx: hx of breast ca, hypothyroidism Assessment:   No contraindications to travel. none   Plan:   Pre travel vaccine = has had many travel vaccines in the past. uptodate except for needs booster for typhoid  Malaria proph = will give malaria proph, malarone  Traveler's diarrhea =will give rx for cipro due to erythromycin allergy

## 2017-01-02 MED FILL — ANUSOL-HC 25 MG SUPPOSITORY: 25 | 14 days supply | Qty: 28 | Fill #0

## 2017-01-27 MED FILL — ESCITALOPRAM 10 MG TABLET: 10 | 90 days supply | Qty: 90 | Fill #2

## 2017-01-27 MED FILL — traZODone HCL 50 MG TABS: 50 | 90 days supply | Qty: 90 | Fill #0 | Status: TO

## 2017-02-04 ENCOUNTER — Other Ambulatory Visit: Payer: Self-pay | Admitting: Obstetrics and Gynecology

## 2017-02-04 DIAGNOSIS — Z1231 Encounter for screening mammogram for malignant neoplasm of breast: Secondary | ICD-10-CM

## 2017-02-06 ENCOUNTER — Ambulatory Visit
Admission: RE | Admit: 2017-02-06 | Discharge: 2017-02-06 | Disposition: A | Discharge status: Home / Self Care | Payer: 59 | Source: Ambulatory Visit | Attending: Obstetrics and Gynecology | Admitting: Obstetrics and Gynecology

## 2017-02-06 DIAGNOSIS — Z1231 Encounter for screening mammogram for malignant neoplasm of breast: Secondary | ICD-10-CM | POA: Diagnosis not present

## 2017-02-07 DIAGNOSIS — Z6825 Body mass index (BMI) 25.0-25.9, adult: Secondary | ICD-10-CM | POA: Diagnosis not present

## 2017-02-07 DIAGNOSIS — R102 Pelvic and perineal pain: Secondary | ICD-10-CM | POA: Diagnosis not present

## 2017-02-07 DIAGNOSIS — B373 Candidiasis of vulva and vagina: Secondary | ICD-10-CM | POA: Diagnosis not present

## 2017-02-07 MED FILL — FLUCONAZOLE 150 MG TABLET: 150 | 4 days supply | Qty: 2 | Fill #0

## 2017-02-21 DIAGNOSIS — Z124 Encounter for screening for malignant neoplasm of cervix: Secondary | ICD-10-CM | POA: Diagnosis not present

## 2017-02-21 DIAGNOSIS — Z01419 Encounter for gynecological examination (general) (routine) without abnormal findings: Secondary | ICD-10-CM | POA: Diagnosis not present

## 2017-02-21 LAB — HM PAP SMEAR

## 2017-02-25 DIAGNOSIS — E784 Other hyperlipidemia: Secondary | ICD-10-CM | POA: Diagnosis not present

## 2017-02-25 DIAGNOSIS — E038 Other specified hypothyroidism: Secondary | ICD-10-CM | POA: Diagnosis not present

## 2017-02-25 DIAGNOSIS — R7301 Impaired fasting glucose: Secondary | ICD-10-CM | POA: Diagnosis not present

## 2017-03-11 ENCOUNTER — Other Ambulatory Visit: Payer: Self-pay | Admitting: Family Medicine

## 2017-03-11 DIAGNOSIS — E039 Hypothyroidism, unspecified: Secondary | ICD-10-CM

## 2017-03-11 MED FILL — LEVOTHYROXINE 88 MCG TABLET: 88 | 90 days supply | Qty: 90 | Fill #0 | Status: TO

## 2017-03-17 MED FILL — NITROFURANTOIN MONO-MCR 100: 100 | 3 days supply | Qty: 6 | Fill #0

## 2017-04-08 DIAGNOSIS — N907 Vulvar cyst: Secondary | ICD-10-CM | POA: Diagnosis not present

## 2017-04-08 DIAGNOSIS — N909 Noninflammatory disorder of vulva and perineum, unspecified: Secondary | ICD-10-CM | POA: Diagnosis not present

## 2017-05-13 MED FILL — traZODone HCL 50 MG TABS: 50 | 90 days supply | Qty: 90 | Fill #0

## 2017-05-14 DIAGNOSIS — H5213 Myopia, bilateral: Secondary | ICD-10-CM | POA: Diagnosis not present

## 2017-05-14 DIAGNOSIS — H52223 Regular astigmatism, bilateral: Secondary | ICD-10-CM | POA: Diagnosis not present

## 2017-05-14 DIAGNOSIS — H524 Presbyopia: Secondary | ICD-10-CM | POA: Diagnosis not present

## 2017-06-16 MED FILL — LEVOTHYROXINE 88 MCG TABLET: 88 | 90 days supply | Qty: 90 | Fill #0

## 2017-07-22 ENCOUNTER — Other Ambulatory Visit: Payer: Self-pay | Admitting: Family Medicine

## 2017-07-22 DIAGNOSIS — F341 Dysthymic disorder: Secondary | ICD-10-CM

## 2017-07-22 MED FILL — ESCITALOPRAM 10 MG TABLET: 10 | 30 days supply | Qty: 30 | Fill #0

## 2017-08-15 MED FILL — traZODone HCL 50 MG TABS: 50 | 90 days supply | Qty: 90 | Fill #1

## 2017-08-26 DIAGNOSIS — K582 Mixed irritable bowel syndrome: Secondary | ICD-10-CM | POA: Diagnosis not present

## 2017-08-26 DIAGNOSIS — L290 Pruritus ani: Secondary | ICD-10-CM | POA: Diagnosis not present

## 2017-08-26 DIAGNOSIS — Z7689 Persons encountering health services in other specified circumstances: Secondary | ICD-10-CM | POA: Diagnosis not present

## 2017-08-26 DIAGNOSIS — K625 Hemorrhage of anus and rectum: Secondary | ICD-10-CM | POA: Diagnosis not present

## 2017-08-26 DIAGNOSIS — R140 Abdominal distension (gaseous): Secondary | ICD-10-CM | POA: Diagnosis not present

## 2017-09-15 MED FILL — LEVOTHYROXINE 88 MCG TABLET: 88 | 90 days supply | Qty: 90 | Fill #1

## 2017-09-30 MED FILL — ESCITALOPRAM 10 MG TABLET: 10 | 30 days supply | Qty: 30 | Fill #1

## 2017-10-09 ENCOUNTER — Encounter: Payer: Self-pay | Admitting: Family Medicine

## 2017-10-09 ENCOUNTER — Ambulatory Visit (INDEPENDENT_AMBULATORY_CARE_PROVIDER_SITE_OTHER): Payer: 59 | Admitting: Family Medicine

## 2017-10-09 VITALS — BP 108/68 | HR 72 | Temp 98.1°F | Wt 154.2 lb

## 2017-10-09 DIAGNOSIS — Z853 Personal history of malignant neoplasm of breast: Secondary | ICD-10-CM

## 2017-10-09 DIAGNOSIS — J310 Chronic rhinitis: Secondary | ICD-10-CM | POA: Diagnosis not present

## 2017-10-09 DIAGNOSIS — E039 Hypothyroidism, unspecified: Secondary | ICD-10-CM

## 2017-10-09 DIAGNOSIS — J309 Allergic rhinitis, unspecified: Secondary | ICD-10-CM | POA: Diagnosis not present

## 2017-10-09 DIAGNOSIS — Z0000 Encounter for general adult medical examination without abnormal findings: Secondary | ICD-10-CM

## 2017-10-09 DIAGNOSIS — K649 Unspecified hemorrhoids: Secondary | ICD-10-CM

## 2017-10-09 DIAGNOSIS — N951 Menopausal and female climacteric states: Secondary | ICD-10-CM

## 2017-10-09 DIAGNOSIS — J45990 Exercise induced bronchospasm: Secondary | ICD-10-CM

## 2017-10-09 DIAGNOSIS — Z8249 Family history of ischemic heart disease and other diseases of the circulatory system: Secondary | ICD-10-CM

## 2017-10-09 DIAGNOSIS — F341 Dysthymic disorder: Secondary | ICD-10-CM | POA: Diagnosis not present

## 2017-10-09 DIAGNOSIS — M1990 Unspecified osteoarthritis, unspecified site: Secondary | ICD-10-CM

## 2017-10-09 LAB — POCT URINALYSIS DIP (PROADVANTAGE DEVICE)
BILIRUBIN UA: NEGATIVE
GLUCOSE UA: NEGATIVE mg/dL
Ketones, POC UA: NEGATIVE mg/dL
Leukocytes, UA: NEGATIVE
NITRITE UA: NEGATIVE
Protein Ur, POC: NEGATIVE mg/dL
RBC UA: NEGATIVE
Specific Gravity, Urine: 1.02
Urobilinogen, Ur: 3.5
pH, UA: 6 (ref 5.0–8.0)

## 2017-10-09 MED ORDER — HYDROCORTISONE 2.5 % RE CREA: 1.0000 "application " | TOPICAL_CREAM | Freq: Two times a day (BID) | RECTAL | 0 refills | Status: DC

## 2017-10-09 MED ORDER — TRAZODONE HCL 50 MG PO TABS: 50.0000 mg | ORAL_TABLET | Freq: Every day | ORAL | 1 refills | Status: DC

## 2017-10-09 MED ORDER — ESCITALOPRAM OXALATE 10 MG PO TABS: ORAL_TABLET | ORAL | 3 refills | Status: DC

## 2017-10-09 MED ORDER — ALBUTEROL SULFATE HFA 108 (90 BASE) MCG/ACT IN AERS: 2.0000 | INHALATION_SPRAY | Freq: Four times a day (QID) | RESPIRATORY_TRACT | 2 refills | Status: DC | PRN

## 2017-10-09 MED FILL — PROCTOZONE-HC 2.5 % CREA: 2.5 | 5 days supply | Qty: 30 | Fill #0

## 2017-10-09 MED FILL — VENTOLIN HFA 90 MCG INHALER: 108 (90 BAS | 25 days supply | Qty: 18 | Fill #0

## 2017-10-09 NOTE — Progress Notes (Signed)
Subjective:    Patient ID: Amanda Jensen, female    DOB: Jan 24, 1966, 52 y.o.   MRN: [id number]  HPI She is here for complete examination.  Her main complaint today is difficulty with hand pain and swelling.  She has noted several of her fingers over the DIP joints of being slightly painful and swollen.  This does tend to come and go.  This sometimes does interfere with her ability to play musical instruments.  She has no symptoms in her wrists, elbows, knees or ankles.  She does have underlying dysthymia and is doing quite nicely on Lexapro.  As only she is apparently taking 10 mg of that and this is also helping with her hot flashes.  She has a history of hemorrhoids and does have some itching involved with that.  Apparently Anusol does help with that and would like a refill.  She does have underlying allergies but has very little difficulty with that.  She also has a history of gustatory rhinitis with  rhinitis.  This causes very little difficulty.  She has a history of breast cancer and is now 10 years down the road.  She gets regular follow-up concerning that.  There is also family history of heart disease with mother dying at age 51.  She has seen cardiology and does have a cardiac calcium score of 0.  She continues on thyroid medication.  She sees her gynecologist regularly.  She also has a previous history of exercise-induced asthma and would like a refill on albuterol just to be on the safe side. Has had a colonoscopy.  The rest of the family and social history as well as health maintenance and immunizations was reviewed. She does state that she does have a slightly elevated creatinine but this is been stable for the last several years. Review of Systems  All other systems reviewed and are negative.      Objective:   Physical Exam BP 108/68 (BP Location: Left Arm, Patient Position: Sitting)   Pulse 72   Temp 98.1 F (36.7 C)   Wt 154 lb 3.2 oz (69.9 kg)   SpO2 99%   BMI 25.66 kg/m     General Appearance:    Alert, cooperative, no distress, appears stated age  Head:    Normocephalic, without obvious abnormality, atraumatic  Eyes:    PERRL, conjunctiva/corneas clear, EOM's intact, fundi    benign  Ears:    Normal TM's and external ear canals  Nose:   Nares normal, mucosa normal, no drainage or sinus   tenderness  Throat:   Lips, mucosa, and tongue normal; teeth and gums normal  Neck:   Supple, no lymphadenopathy;  thyroid:  no   enlargement/tenderness/nodules; no carotid   bruit or JVD  We discussed follow-up concerning the arthritis and I will do some   Lungs:     Clear to auscultation bilaterally without wheezes, rales or     ronchi; respirations unlabored  Chest Wall:    No tenderness or deformity   Heart:    Regular rate and rhythm, S1 and S2 normal, no murmur, rub   or gallop  Breast Exam:    Deferred to GYN  Abdomen:     Soft, non-tender, nondistended, normoactive bowel sounds,    no masses, her liver is minimally palpable with  inspiration.  I discussed the slightly enlarged liver and at this point  Genitalia:    Deferred to GYN     Extremities:   No  clubbing, cyanosis slight edema is noted in several of her DIP joints but they are not red or warm.  Pulses:   2+ and symmetric all extremities  Skin:   Skin color, texture, turgor normal, no rashes or lesions  Lymph nodes:   Cervical, supraclavicular, and axillary nodes normal  Neurologic:   CNII-XII intact, normal strength, sensation and gait; reflexes 2+ and symmetric throughout          Psych:   Normal mood, affect, hygiene and grooming.         Assessment & Plan:  Family history of heart disease in female family member before age 89 - Plan: Lipid panel  Dysthymia - Plan: escitalopram (LEXAPRO) 10 MG tablet  Hemorrhoids, unspecified hemorrhoid type - Plan: hydrocortisone (ANUSOL-HC) 2.5 % rectal cream  Allergic rhinitis, unspecified seasonality, unspecified trigger  Exercise-induced asthma - Plan:  albuterol (PROVENTIL HFA;VENTOLIN HFA) 108 (90 Base) MCG/ACT inhaler  Gustatory rhinitis  History of breast cancer in female  Hypothyroidism, unspecified type - Plan: TSH  Routine general medical examination at a health care facility - Plan: CBC with Differential/Platelet, Comprehensive metabolic panel, Lipid panel, POCT Urinalysis DIP (Proadvantage Device)  Arthritis - Plan: RA Expanded Profile, CANCELED: Rheumatoid Arthritis Diagnostic Panel, Comprehensive Screening to make sure that there is no major issues although her symptoms do sound like an inflammatory arthropathy. I discussed the slightly palpable liver with her and at this point no further intervention.  She will continue on her present medications.

## 2017-10-09 NOTE — Patient Instructions (Signed)

## 2017-10-10 LAB — COMPREHENSIVE METABOLIC PANEL
A/G RATIO: 1.8 (ref 1.2–2.2)
ALT: 16 IU/L (ref 0–32)
AST: 18 IU/L (ref 0–40)
Albumin: 4.3 g/dL (ref 3.5–5.5)
Alkaline Phosphatase: 39 IU/L (ref 39–117)
BILIRUBIN TOTAL: 0.3 mg/dL (ref 0.0–1.2)
BUN/Creatinine Ratio: 16 (ref 9–23)
BUN: 16 mg/dL (ref 6–24)
CHLORIDE: 104 mmol/L (ref 96–106)
CO2: 25 mmol/L (ref 20–29)
Calcium: 9.8 mg/dL (ref 8.7–10.2)
Creatinine, Ser: 1 mg/dL (ref 0.57–1.00)
GFR calc non Af Amer: 65 mL/min/{1.73_m2} (ref >59)
GFR, EST AFRICAN AMERICAN: 75 mL/min/{1.73_m2} (ref >59)
Globulin, Total: 2.4 g/dL (ref 1.5–4.5)
Glucose: 99 mg/dL (ref 65–99)
Potassium: 4.3 mmol/L (ref 3.5–5.2)
Sodium: 143 mmol/L (ref 134–144)
TOTAL PROTEIN: 6.7 g/dL (ref 6.0–8.5)

## 2017-10-10 LAB — CBC WITH DIFFERENTIAL/PLATELET
Basophils Absolute: 0 10*3/uL (ref 0.0–0.2)
Basos: 0 %
EOS (ABSOLUTE): 0.1 10*3/uL (ref 0.0–0.4)
Eos: 2 %
HEMOGLOBIN: 13.2 g/dL (ref 11.1–15.9)
Hematocrit: 37.8 % (ref 34.0–46.6)
IMMATURE GRANS (ABS): 0 10*3/uL (ref 0.0–0.1)
Immature Granulocytes: 0 %
LYMPHS: 32 %
Lymphocytes Absolute: 2.1 10*3/uL (ref 0.7–3.1)
MCH: 32 pg (ref 26.6–33.0)
MCHC: 34.9 g/dL (ref 31.5–35.7)
MCV: 92 fL (ref 79–97)
Monocytes Absolute: 0.4 10*3/uL (ref 0.1–0.9)
Monocytes: 6 %
NEUTROS ABS: 3.9 10*3/uL (ref 1.4–7.0)
Neutrophils: 60 %
Platelets: 266 10*3/uL (ref 150–379)
RBC: 4.12 x10E6/uL (ref 3.77–5.28)
RDW: 13.7 % (ref 12.3–15.4)
WBC: 6.6 10*3/uL (ref 3.4–10.8)

## 2017-10-10 LAB — LIPID PANEL
Chol/HDL Ratio: 2.8 ratio (ref 0.0–4.4)
Cholesterol, Total: 219 mg/dL — ABNORMAL HIGH (ref 100–199)
HDL: 78 mg/dL (ref >39)
LDL Calculated: 125 mg/dL — ABNORMAL HIGH (ref 0–99)
Triglycerides: 78 mg/dL (ref 0–149)
VLDL CHOLESTEROL CAL: 16 mg/dL (ref 5–40)

## 2017-10-10 LAB — RA EXPANDED PROFILE
CRP: 1.4 mg/L (ref 0.0–4.9)
CYCLIC CITRULLIN PEPTIDE AB: 5 U (ref 0–19)
Rhuematoid fact SerPl-aCnc: <10 IU/mL (ref 0.0–13.9)
Sed Rate: 4 mm/hr (ref 0–40)

## 2017-10-10 LAB — TSH: TSH: 1.74 u[IU]/mL (ref 0.450–4.500)

## 2017-11-03 DIAGNOSIS — M13849 Other specified arthritis, unspecified hand: Secondary | ICD-10-CM | POA: Diagnosis not present

## 2017-11-03 DIAGNOSIS — M79642 Pain in left hand: Secondary | ICD-10-CM | POA: Diagnosis not present

## 2017-11-03 DIAGNOSIS — M79641 Pain in right hand: Secondary | ICD-10-CM | POA: Diagnosis not present

## 2017-11-13 MED FILL — traZODone HCL 50 MG TABS: 50 | 90 days supply | Qty: 90 | Fill #0

## 2017-12-01 MED FILL — ESCITALOPRAM 10 MG TABLET: 10 | 90 days supply | Qty: 90 | Fill #0

## 2017-12-16 MED FILL — ONDANSETRON ODT 8 MG TABLET: 8 | 2 days supply | Qty: 2 | Fill #0

## 2017-12-29 ENCOUNTER — Other Ambulatory Visit: Payer: Self-pay | Admitting: Internal Medicine

## 2017-12-29 DIAGNOSIS — Z789 Other specified health status: Secondary | ICD-10-CM

## 2017-12-29 DIAGNOSIS — Z9189 Other specified personal risk factors, not elsewhere classified: Secondary | ICD-10-CM

## 2017-12-29 DIAGNOSIS — Z7184 Encounter for health counseling related to travel: Secondary | ICD-10-CM

## 2017-12-29 MED ORDER — ATOVAQUONE-PROGUANIL HCL 250-100 MG PO TABS: 1.0000 | ORAL_TABLET | Freq: Every day | ORAL | 0 refills | Status: DC

## 2017-12-29 MED ORDER — AZITHROMYCIN 500 MG PO TABS: 500.0000 mg | ORAL_TABLET | Freq: Every day | ORAL | 0 refills | Status: DC

## 2017-12-29 MED FILL — ATOVAQUONE-PROGUANIL 250-10: 250-100 | 19 days supply | Qty: 19 | Fill #0

## 2017-12-29 MED FILL — AZITHROMYCIN 500 MG TABLET: 500 | 5 days supply | Qty: 5 | Fill #0

## 2017-12-29 NOTE — Progress Notes (Signed)
Gerald Stabs and Louanne Belton will be leaving for [place] for 10 day trip in roughly 2 weeks. Will prescribe malarone to start 2 days prior to trip through a week after return, plus will give azithro if needed for traveler's diarhea

## 2018-02-23 ENCOUNTER — Other Ambulatory Visit: Payer: Self-pay | Admitting: Obstetrics and Gynecology

## 2018-02-23 DIAGNOSIS — Z1231 Encounter for screening mammogram for malignant neoplasm of breast: Secondary | ICD-10-CM

## 2018-02-25 ENCOUNTER — Ambulatory Visit
Admission: RE | Admit: 2018-02-25 | Discharge: 2018-02-25 | Disposition: A | Discharge status: Home / Self Care | Payer: 59 | Source: Ambulatory Visit | Attending: Obstetrics and Gynecology | Admitting: Obstetrics and Gynecology

## 2018-02-25 DIAGNOSIS — Z1231 Encounter for screening mammogram for malignant neoplasm of breast: Secondary | ICD-10-CM | POA: Diagnosis not present

## 2018-03-20 MED FILL — ESCITALOPRAM 10 MG TABLET: 10 | 90 days supply | Qty: 90 | Fill #1

## 2018-03-20 MED FILL — traZODone HCL 50 MG TABS: 50 | 90 days supply | Qty: 90 | Fill #1

## 2018-03-23 ENCOUNTER — Other Ambulatory Visit: Payer: Self-pay | Admitting: Family Medicine

## 2018-03-23 DIAGNOSIS — E039 Hypothyroidism, unspecified: Secondary | ICD-10-CM

## 2018-03-23 MED ORDER — LEVOTHYROXINE SODIUM 88 MCG PO TABS: 88.0000 ug | ORAL_TABLET | Freq: Every day | ORAL | 0 refills | Status: DC

## 2018-03-23 MED FILL — LEVOTHYROXINE 88 MCG TABLET: 88 | 90 days supply | Qty: 90 | Fill #0

## 2018-06-22 MED FILL — ESCITALOPRAM 10 MG TABLET: 10 | 90 days supply | Qty: 90 | Fill #2

## 2018-06-23 ENCOUNTER — Encounter: Payer: Self-pay | Admitting: Family Medicine

## 2018-06-23 ENCOUNTER — Ambulatory Visit (INDEPENDENT_AMBULATORY_CARE_PROVIDER_SITE_OTHER): Payer: 59 | Admitting: Family Medicine

## 2018-06-23 VITALS — BP 116/70 | HR 62 | Temp 98.0°F | Wt 157.0 lb

## 2018-06-23 DIAGNOSIS — E039 Hypothyroidism, unspecified: Secondary | ICD-10-CM | POA: Diagnosis not present

## 2018-06-23 DIAGNOSIS — Z853 Personal history of malignant neoplasm of breast: Secondary | ICD-10-CM | POA: Diagnosis not present

## 2018-06-23 DIAGNOSIS — N951 Menopausal and female climacteric states: Secondary | ICD-10-CM

## 2018-06-23 DIAGNOSIS — Z8249 Family history of ischemic heart disease and other diseases of the circulatory system: Secondary | ICD-10-CM | POA: Diagnosis not present

## 2018-06-23 DIAGNOSIS — J309 Allergic rhinitis, unspecified: Secondary | ICD-10-CM | POA: Diagnosis not present

## 2018-06-23 DIAGNOSIS — F341 Dysthymic disorder: Secondary | ICD-10-CM | POA: Diagnosis not present

## 2018-06-23 MED ORDER — ESCITALOPRAM OXALATE 10 MG PO TABS: ORAL_TABLET | ORAL | 3 refills | Status: DC

## 2018-06-23 MED ORDER — LEVOTHYROXINE SODIUM 88 MCG PO TABS: 88.0000 ug | ORAL_TABLET | Freq: Every day | ORAL | 1 refills | Status: DC

## 2018-06-23 MED FILL — LEVOTHYROXINE 88 MCG TABLET: 88 | 90 days supply | Qty: 90 | Fill #0

## 2018-06-23 NOTE — Progress Notes (Signed)
   Subjective:    Patient ID: Amanda Jensen, female    DOB: 1965-10-20, 52 y.o.   MRN: [id number]  HPI She is here for an interval evaluation.  She has had difficulty with sleep issues but at the present time seems to be doing fairly well.  She does have trazodone but has not used this.  She continues on Lexapro for her hot flashes and mood swings and is very comfortable with this dosing.  Continues on her thyroid medication.  She will have blood drawn this spring.  She did have a colonoscopy at age 18.  Breast cancer was diagnosed in 2009 and she was given radiation as well as tamoxifen and has done well on this.  There is a family history of heart disease.  Her allergies seem to be under good control.   Review of Systems     Objective:   Physical Exam Alert and in no distress. Tympanic membranes and canals are normal. Pharyngeal area is normal. Neck is supple without adenopathy or thyromegaly. Cardiac exam shows a regular sinus rhythm without murmurs or gallops. Lungs are clear to auscultation.        Assessment & Plan:  Family history of heart disease in female family member before age 19  Dysthymia - Plan: escitalopram (LEXAPRO) 10 MG tablet  Hypothyroidism - Plan: levothyroxine (SYNTHROID, LEVOTHROID) 88 MCG tablet  Allergic rhinitis, unspecified seasonality, unspecified trigger  History of breast cancer in female  Hypothyroidism, unspecified type  Hot flashes due to menopause She will continue on her present medication regimen.  She is up-to-date on her immunizations.  Follow-up in 1 year or sooner if any problems.

## 2018-10-08 MED FILL — LEVOTHYROXINE 88 MCG TABLET: 88 | 90 days supply | Qty: 90 | Fill #1

## 2018-10-08 MED FILL — ESCITALOPRAM 10 MG TABLET: 10 | 90 days supply | Qty: 90 | Fill #3

## 2019-02-11 ENCOUNTER — Other Ambulatory Visit: Payer: Self-pay | Admitting: Family Medicine

## 2019-02-11 DIAGNOSIS — E039 Hypothyroidism, unspecified: Secondary | ICD-10-CM

## 2019-02-11 DIAGNOSIS — F341 Dysthymic disorder: Secondary | ICD-10-CM

## 2019-02-11 MED ORDER — LEVOTHYROXINE SODIUM 88 MCG PO TABS: 88.0000 ug | ORAL_TABLET | Freq: Every day | ORAL | 1 refills | Status: DC

## 2019-02-11 MED ORDER — ESCITALOPRAM OXALATE 10 MG PO TABS: ORAL_TABLET | ORAL | 1 refills | Status: DC

## 2019-02-11 MED FILL — LEVOTHYROXINE 88 MCG TABLET: 88 | 90 days supply | Qty: 90 | Fill #0

## 2019-02-11 MED FILL — ESCITALOPRAM 10 MG TABLET: 10 | 90 days supply | Qty: 90 | Fill #0

## 2019-03-01 ENCOUNTER — Other Ambulatory Visit: Payer: Self-pay | Admitting: Obstetrics and Gynecology

## 2019-03-01 DIAGNOSIS — Z1231 Encounter for screening mammogram for malignant neoplasm of breast: Secondary | ICD-10-CM

## 2019-04-02 ENCOUNTER — Other Ambulatory Visit: Payer: Self-pay | Admitting: Physician Assistant

## 2019-04-02 DIAGNOSIS — L308 Other specified dermatitis: Secondary | ICD-10-CM | POA: Diagnosis not present

## 2019-04-02 DIAGNOSIS — L309 Dermatitis, unspecified: Secondary | ICD-10-CM | POA: Diagnosis not present

## 2019-04-02 DIAGNOSIS — D485 Neoplasm of uncertain behavior of skin: Secondary | ICD-10-CM | POA: Diagnosis not present

## 2019-04-02 DIAGNOSIS — L500 Allergic urticaria: Secondary | ICD-10-CM | POA: Diagnosis not present

## 2019-04-02 MED FILL — LEVOCETIRIZINE 5 MG TABLET: 5 | 90 days supply | Qty: 90 | Fill #0

## 2019-04-13 ENCOUNTER — Other Ambulatory Visit: Payer: Self-pay

## 2019-04-13 ENCOUNTER — Ambulatory Visit
Admission: RE | Admit: 2019-04-13 | Discharge: 2019-04-13 | Disposition: A | Discharge status: Home / Self Care | Payer: 59 | Source: Ambulatory Visit | Attending: Obstetrics and Gynecology | Admitting: Obstetrics and Gynecology

## 2019-04-13 DIAGNOSIS — Z1231 Encounter for screening mammogram for malignant neoplasm of breast: Secondary | ICD-10-CM | POA: Diagnosis not present

## 2019-05-25 MED FILL — ESCITALOPRAM 10 MG TABLET: 10 | 90 days supply | Qty: 90 | Fill #1

## 2019-05-25 MED FILL — LEVOTHYROXINE 88 MCG TABLET: 88 | 90 days supply | Qty: 90 | Fill #1

## 2019-06-29 ENCOUNTER — Encounter: Payer: Self-pay | Admitting: Sports Medicine

## 2019-06-29 ENCOUNTER — Other Ambulatory Visit: Payer: Self-pay

## 2019-06-29 ENCOUNTER — Ambulatory Visit (INDEPENDENT_AMBULATORY_CARE_PROVIDER_SITE_OTHER): Payer: 59 | Admitting: Sports Medicine

## 2019-06-29 VITALS — BP 100/62 | Ht 65.0 in | Wt 148.0 lb

## 2019-06-29 DIAGNOSIS — M25562 Pain in left knee: Secondary | ICD-10-CM

## 2019-06-29 NOTE — Progress Notes (Signed)
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 She is currently participating in a fundraiser in which she runs 4 miles a day for each donation. She has been running 4 miles every day for the last 1 to 2 weeks. There was no fall or twisting of the knee. She does run with the dog who will occasionally pull her in different directions. The pain is on the medial side of her left knee.  Heel strike.  She is now rising on her toes to avoid heel strike.  She denies radiating pain, swelling, numbness, weakness, skin changes or instability.  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     ROS:     See HPI.  No locking/ no giving way/ no swelling of knee  PERTINENT  PMH / PSH / FH / SH:  Past Medical, Surgical, Social, and Family History Reviewed & Updated in the EMR. Pertinent findings include:  Exercise-induced asthma   OBJECTIVE:  BP 100/62   Ht 5\' 5"  (1.651 m)   Wt 148 lb (67.1 kg)   BMI 24.63 kg/m  Physical Exam:  Vital signs are reviewed.   GEN: Alert and oriented, NAD Pulm: Breathing unlabored PSY: normal mood, congruent affect  MSK: Left knee: Normal to inspection with no erythema or effusion or obvious bony abnormalities. Mild tenderness to palpation along the medial inferior patellar pole ROM full in flexion and extension and lower leg rotation. Ligaments with solid consistent endpoints including ACL, PCL, LCL, MCL. Negative Mcmurray's and Thessaly tests. Non painful patellar compression. Patellar glide without crepitus. Patellar and quadriceps tendons unremarkable. Hamstring and quadriceps  strength is normal.  Neurovascularly intact.  Right knee: Normal to inspection with no erythema or effusion or obvious bony abnormalities. Palpation normal with no warmth, joint line tenderness, patellar tenderness, or condyle tenderness. ROM full in flexion and extension and lower leg rotation. Ligaments with solid consistent endpoints including ACL, PCL, LCL, MCL. Negative Mcmurray's and Thessaly tests. Non painful patellar compression. Patellar glide without crepitus. Patellar and quadriceps tendons unremarkable. Hamstring and quadriceps strength is normal.  Neurovascularly intact.   Left Knee Ultrasound Suprapatellar pouch visualized in long and short axis with no abnormality. Quadriceps tendon visualized in both long and short axis without abnormality.   Patellar Tendon is visualized in long and short axis without abnormality. There is small effusion of infra patella bursa. Medial meniscus without frank tear, but hypoechoic changes along the joint line  MCL visualized with no abnormality Lateral mensicus and LCL visualized with no abnormality Popliteal fossa was visualized, no Baker's cyst   IMPRESSION: Mild hypoechoic change along medial joint line consistent with contusion of medial meniscus  Ultrasound and interpretation by Dr. Kathrynn Speed and Wolfgang Phoenix. Fields, MD   ASSESSMENT & PLAN:   1. Left knee pain  Overall I do not think patient has torn her meniscus.  There are signs of localized swelling along medial joint line likely from the continuous heel striking in the setting of increased mileage and frequency of running.  We have advised her to cross train and use treadmill for running.  We have fitted  her for a compression sleeve today.  Recommended anti-inflammatory and ice as needed.   Lanier Clam, DO, ATC Sports Medicine Fellow  I observed and examined the patient with the Dr. Adria Dill and agree with assessment and plan.  Note reviewed and modified by me. Ila Mcgill, MD

## 2019-07-28 ENCOUNTER — Telehealth: Payer: Self-pay | Admitting: Internal Medicine

## 2019-07-28 NOTE — Telephone Encounter (Signed)
53yo F who is close contact + covid patient (her partner). Her partner started to have some symptoms on 12/4 evening. They have been together for the past week and outings. Rapid antigen test was negative on 12/6. Patient remains negative.she has also been enrolled in covid vaccine trial and suspects she was in the treatment arm. This result has been reported to the [facility] on 07/28/19  Amanda Jensen B. [facility] [phone]

## 2019-08-24 ENCOUNTER — Other Ambulatory Visit: Payer: Self-pay | Admitting: Family Medicine

## 2019-08-24 DIAGNOSIS — E039 Hypothyroidism, unspecified: Secondary | ICD-10-CM

## 2019-08-24 MED ORDER — LEVOTHYROXINE SODIUM 88 MCG PO TABS: 88.0000 ug | ORAL_TABLET | Freq: Every day | ORAL | 0 refills | Status: DC

## 2019-08-24 MED FILL — ESCITALOPRAM 10 MG TABLET: 10 | 90 days supply | Qty: 90 | Fill #0

## 2019-08-24 MED FILL — LEVOTHYROXINE 88 MCG TABLET: 88 | 90 days supply | Qty: 90 | Fill #0

## 2019-11-03 ENCOUNTER — Telehealth: Payer: Self-pay | Admitting: Family Medicine

## 2019-11-03 NOTE — Telephone Encounter (Signed)
Pt called and is coming in for a CPE on 04/26 and would like to come in for her thyroid before because she is due for refills if that is ok pt can be reached at [phone]

## 2019-11-04 NOTE — Telephone Encounter (Signed)
I called and left her a message about letting me know what a good day would be for her to come in to get blood work.

## 2019-11-05 NOTE — Telephone Encounter (Signed)
Pt was left a message for pt to call office back to schedule a nurses visit. [place]

## 2019-11-29 ENCOUNTER — Other Ambulatory Visit: Payer: Self-pay | Admitting: Family Medicine

## 2019-11-29 DIAGNOSIS — E039 Hypothyroidism, unspecified: Secondary | ICD-10-CM

## 2019-11-29 DIAGNOSIS — F341 Dysthymic disorder: Secondary | ICD-10-CM

## 2019-11-29 MED ORDER — LEVOTHYROXINE SODIUM 88 MCG PO TABS: 88.0000 ug | ORAL_TABLET | Freq: Every day | ORAL | 0 refills | Status: DC

## 2019-11-29 MED ORDER — ESCITALOPRAM OXALATE 10 MG PO TABS: ORAL_TABLET | ORAL | 3 refills | Status: DC

## 2019-11-29 MED FILL — ESCITALOPRAM 10 MG TABLET: 10 | 90 days supply | Qty: 90 | Fill #0

## 2019-11-29 MED FILL — LEVOTHYROXINE 88 MCG TABLET: 88 | 90 days supply | Qty: 90 | Fill #0

## 2019-12-02 DIAGNOSIS — H5213 Myopia, bilateral: Secondary | ICD-10-CM | POA: Diagnosis not present

## 2019-12-02 DIAGNOSIS — H52223 Regular astigmatism, bilateral: Secondary | ICD-10-CM | POA: Diagnosis not present

## 2019-12-02 DIAGNOSIS — H524 Presbyopia: Secondary | ICD-10-CM | POA: Diagnosis not present

## 2019-12-13 ENCOUNTER — Ambulatory Visit (INDEPENDENT_AMBULATORY_CARE_PROVIDER_SITE_OTHER): Payer: 59 | Admitting: Family Medicine

## 2019-12-13 ENCOUNTER — Encounter: Payer: Self-pay | Admitting: Family Medicine

## 2019-12-13 ENCOUNTER — Other Ambulatory Visit: Payer: Self-pay

## 2019-12-13 VITALS — BP 104/68 | HR 72 | Temp 98.0°F | Ht 64.0 in | Wt 151.4 lb

## 2019-12-13 DIAGNOSIS — K640 First degree hemorrhoids: Secondary | ICD-10-CM

## 2019-12-13 DIAGNOSIS — Z8249 Family history of ischemic heart disease and other diseases of the circulatory system: Secondary | ICD-10-CM | POA: Diagnosis not present

## 2019-12-13 DIAGNOSIS — J45990 Exercise induced bronchospasm: Secondary | ICD-10-CM | POA: Diagnosis not present

## 2019-12-13 DIAGNOSIS — M1990 Unspecified osteoarthritis, unspecified site: Secondary | ICD-10-CM

## 2019-12-13 DIAGNOSIS — Z853 Personal history of malignant neoplasm of breast: Secondary | ICD-10-CM | POA: Diagnosis not present

## 2019-12-13 DIAGNOSIS — J301 Allergic rhinitis due to pollen: Secondary | ICD-10-CM | POA: Diagnosis not present

## 2019-12-13 DIAGNOSIS — E039 Hypothyroidism, unspecified: Secondary | ICD-10-CM

## 2019-12-13 DIAGNOSIS — N951 Menopausal and female climacteric states: Secondary | ICD-10-CM

## 2019-12-13 DIAGNOSIS — Z0000 Encounter for general adult medical examination without abnormal findings: Secondary | ICD-10-CM

## 2019-12-13 DIAGNOSIS — E782 Mixed hyperlipidemia: Secondary | ICD-10-CM

## 2019-12-13 DIAGNOSIS — E038 Other specified hypothyroidism: Secondary | ICD-10-CM

## 2019-12-13 DIAGNOSIS — F341 Dysthymic disorder: Secondary | ICD-10-CM

## 2019-12-13 MED ORDER — ESCITALOPRAM OXALATE 10 MG PO TABS: ORAL_TABLET | ORAL | 3 refills | Status: DC

## 2019-12-13 MED ORDER — ATORVASTATIN CALCIUM 20 MG PO TABS: 20.0000 mg | ORAL_TABLET | Freq: Every day | ORAL | 0 refills | Status: DC

## 2019-12-13 MED ORDER — LEVOTHYROXINE SODIUM 88 MCG PO TABS: 88.0000 ug | ORAL_TABLET | Freq: Every day | ORAL | 0 refills | Status: DC

## 2019-12-13 MED ORDER — ALBUTEROL SULFATE HFA 108 (90 BASE) MCG/ACT IN AERS: 2.0000 | INHALATION_SPRAY | Freq: Four times a day (QID) | RESPIRATORY_TRACT | 1 refills | Status: DC | PRN

## 2019-12-13 MED FILL — ALBUTEROL SULFATE HFA 108 (: 108 (90 BAS | 25 days supply | Qty: 18 | Fill #0

## 2019-12-13 MED FILL — ATORVASTATIN 20 MG TABLET: 20 | 90 days supply | Qty: 90 | Fill #0

## 2019-12-13 NOTE — Progress Notes (Signed)
   Subjective:    Patient ID: Amanda Jensen, female    DOB: 07-Dec-1965, 54 y.o.   MRN: [id number]  HPI She is here for complete examination.  She did have recent blood work which did show an LDL of 150 with a total of 250.  Her diet is pretty good in terms of cutting back on high cholesterol containing foods.  She does have a family history of heart disease with her mother dying at age 56 of heart attack.  She apparently also had a hemoglobin A1c which was 5.6.  She continues on her thyroid medicine is having no difficulty with that.  She rarely has difficulty with asthma using the albuterol only twice per year.  She is now keeping her self well-regulated with a peppermint formula which helps with her GI system and keeps her from having hemorrhoids.  Allergies cause very little difficulty.  She has had difficulty in the past with occasional arthritic aches and pains but none recently.  She does see her gynecologist fairly regularly and does get yearly mammograms.  Lexapro is helping with controlling her hot flashes.  Family and social history as well as health maintenance and immunizations was reviewed.  Her work and home life are going quite well.  She has been married now for 7 years.   Review of Systems  All other systems reviewed and are negative.      Objective:   Physical Exam Alert and in no distress. Tympanic membranes and canals are normal. Pharyngeal area is normal. Neck is supple without adenopathy or thyromegaly. Cardiac exam shows a regular sinus rhythm without murmurs or gallops. Lungs are clear to auscultation. Abdominal exam shows normal bowel sounds without masses or tenderness.       Assessment & Plan:  Routine general medical examination at a health care facility  Exercise-induced asthma - Plan: albuterol (VENTOLIN HFA) 108 (90 Base) MCG/ACT inhaler  Other specified hypothyroidism  Hot flashes due to menopause - Plan: escitalopram (LEXAPRO) 10 MG tablet  History of  breast cancer in female  Grade I hemorrhoids  Family history of heart disease in female family member before age 79  Arthritis  Seasonal allergic rhinitis due to pollen  Dysthymia  Hypothyroidism - Plan: levothyroxine (SYNTHROID) 47 MCG tablet  Mixed hyperlipidemia - Plan: atorvastatin (LIPITOR) 20 MG tablet  I will start her on Lipitor.  Discussed possible side effects.  She is to return here in 2 months for recheck on her panel.  She will continue on her other medications.  She was comfortable with all this.

## 2019-12-14 LAB — POCT URINALYSIS DIP (PROADVANTAGE DEVICE)
Bilirubin, UA: NEGATIVE
Blood, UA: NEGATIVE
Glucose, UA: NEGATIVE mg/dL
Ketones, POC UA: NEGATIVE mg/dL
Leukocytes, UA: NEGATIVE
Nitrite, UA: NEGATIVE
Protein Ur, POC: NEGATIVE mg/dL
Specific Gravity, Urine: 1.025
Urobilinogen, Ur: 0.2
pH, UA: 6 (ref 5.0–8.0)

## 2019-12-14 NOTE — Addendum Note (Signed)
Addended by: Elyse Jarvis on: 12/14/2019 09:49 AM   Modules accepted: Orders

## 2019-12-17 ENCOUNTER — Encounter: Payer: Self-pay | Admitting: Family Medicine

## 2019-12-27 ENCOUNTER — Telehealth: Payer: Self-pay | Admitting: Family Medicine

## 2019-12-27 NOTE — Telephone Encounter (Signed)
Received requested records from [facility] 

## 2020-01-28 ENCOUNTER — Telehealth: Payer: Self-pay | Admitting: Family Medicine

## 2020-01-28 NOTE — Telephone Encounter (Signed)
Requested records received from [facility]

## 2020-02-08 ENCOUNTER — Encounter: Payer: Self-pay | Admitting: Family Medicine

## 2020-02-09 ENCOUNTER — Ambulatory Visit (INDEPENDENT_AMBULATORY_CARE_PROVIDER_SITE_OTHER): Payer: 59 | Admitting: Family Medicine

## 2020-02-09 ENCOUNTER — Encounter: Payer: Self-pay | Admitting: Family Medicine

## 2020-02-09 VITALS — BP 100/68 | HR 67 | Temp 97.3°F | Wt 151.6 lb

## 2020-02-09 DIAGNOSIS — M25562 Pain in left knee: Secondary | ICD-10-CM | POA: Diagnosis not present

## 2020-02-09 DIAGNOSIS — E782 Mixed hyperlipidemia: Secondary | ICD-10-CM | POA: Diagnosis not present

## 2020-02-09 DIAGNOSIS — Z1159 Encounter for screening for other viral diseases: Secondary | ICD-10-CM

## 2020-02-09 DIAGNOSIS — Z8249 Family history of ischemic heart disease and other diseases of the circulatory system: Secondary | ICD-10-CM | POA: Diagnosis not present

## 2020-02-09 NOTE — Progress Notes (Addendum)
   Subjective:    Patient ID: Amanda Jensen, female    DOB: 1965/10/03, 54 y.o.   MRN: [id number]  HPI She is here for recheck on her lipids.  There is a family history of heart disease with her mother dying of an MI at age 70.  She had blood work done at the hospital which showed an LDL of 150.  She is now taking Lipitor and having no difficulties with that. She is a runner usually running less than 28 miles per week however approximately week ago she was running on the beach and did develop some left lateral posterior discomfort.  No popping, locking or grinding.  She has had previous difficulty with that knee and did have an ultrasound which was apparently negative. Review of her record also indicates the need for hepatitis C screening. Review of Systems     Objective:   Physical Exam Left knee exam shows no effusion.  No tenderness over joint line.  Anterior drawer negative.  Medial and lateral collateral ligaments intact.  McMurray's testing negative.       Assessment & Plan:  Family history of heart disease in female family member before age 20 - Plan: Lipid panel  Mixed hyperlipidemia  Left knee pain, unspecified chronicity  Need for hepatitis C screening test - Plan: Hepatitis C antibody I recommended conservative care for her knee.  She cannot take Aleve so recommend Tylenol and/or ibuprofen and relative rest.  She was comfortable with that. 6/24 the LDL is 91 which is decent but I want lower swelling range increase Lipitor to 40 mg

## 2020-02-10 ENCOUNTER — Other Ambulatory Visit: Payer: Self-pay | Admitting: Family Medicine

## 2020-02-10 ENCOUNTER — Ambulatory Visit: Payer: 59 | Admitting: Family Medicine

## 2020-02-10 LAB — LIPID PANEL
Chol/HDL Ratio: 2.3 ratio (ref 0.0–4.4)
Cholesterol, Total: 193 mg/dL (ref 100–199)
HDL: 85 mg/dL (ref >39)
LDL Chol Calc (NIH): 91 mg/dL (ref 0–99)
Triglycerides: 95 mg/dL (ref 0–149)
VLDL Cholesterol Cal: 17 mg/dL (ref 5–40)

## 2020-02-10 LAB — HEPATITIS C ANTIBODY: Hep C Virus Ab: <0.1 s/co ratio (ref 0.0–0.9)

## 2020-02-10 MED ORDER — ATORVASTATIN CALCIUM 40 MG PO TABS: 40.0000 mg | ORAL_TABLET | Freq: Every day | ORAL | 3 refills | Status: DC

## 2020-02-10 MED FILL — ATORVASTATIN 40 MG TABLET: 40 | 90 days supply | Qty: 90 | Fill #0

## 2020-02-10 NOTE — Addendum Note (Signed)
Addended by: Denita Lung on: 02/10/2020 12:18 PM   Modules accepted: Orders

## 2020-03-01 ENCOUNTER — Ambulatory Visit (INDEPENDENT_AMBULATORY_CARE_PROVIDER_SITE_OTHER): Payer: 59 | Admitting: Family Medicine

## 2020-03-01 ENCOUNTER — Ambulatory Visit: Payer: Self-pay

## 2020-03-01 ENCOUNTER — Other Ambulatory Visit: Payer: Self-pay

## 2020-03-01 VITALS — BP 106/72 | Ht 65.0 in | Wt 148.0 lb

## 2020-03-01 DIAGNOSIS — M25562 Pain in left knee: Secondary | ICD-10-CM | POA: Diagnosis not present

## 2020-03-01 NOTE — Patient Instructions (Signed)
You have an irritation of your lateral meniscus but no evidence of tear. Avoid squats, twisting, lunges for now. Cross train with cycling, swimming, elliptical is also ok if this doesn't worsen the pain. Focus on quad rehab and do this daily as directly. Icing 15 minutes at a time 3-4 times a day. I'd go back to wearing the old shoes for now until this calms down. Tylenol if needed for pain. Sleeve when you exercise may be helpful as well. Follow up with me in 5-6 weeks for reevaluation.

## 2020-03-02 ENCOUNTER — Encounter: Payer: Self-pay | Admitting: Family Medicine

## 2020-03-02 MED FILL — ESCITALOPRAM 10 MG TABLET: 10 | 90 days supply | Qty: 90 | Fill #1

## 2020-03-02 MED FILL — LEVOTHYROXINE 88 MCG TABLET: 88 | 90 days supply | Qty: 90 | Fill #0

## 2020-03-02 NOTE — Progress Notes (Signed)
PCP: Denita Lung, MD  Subjective:   HPI: Patient is a 54 y.o. female here for left knee pain.  Patient reports she started running more frequently last fall running about 4 miles at a time almost daily. Had a mild ache medial knee back then but did not accelerate and was able to continue exercising. Had ultrasound then and noted to have contusion. She backed down to running about 2-3 times a week, 4 miles at a time. Also runs with her dog (a boxer). A few weeks ago was on the beach and also spent more time in the car, felt her knee was more stiff.  Took time off of running for about 2 weeks then 1.5 weeks ago ran at a slower pace. Unfortunately could barely walk after this and knee felt stiff with pain lateral aspect of knee with throbbing. Gets a pain in this area, knee will pop and feels better. Taking ibuprofen which helps. Tried to get new shoes but first time she wore these with walking she developed pain plantar lateral aspect of her left foot with cramping. No swelling or bruising of knee.  Past Medical History:  Diagnosis Date  . Allergy history unknown    seasonal-on zyrtec  . Cancer [facility])    history of left breast cancer-on tamoxifen  . Complication of anesthesia    succinyl choline reacttion-intense muscle a ches  . Constipation    hypothyroidism related(goes away with med adjustment)  . Depression    h/o-not currently on med  . Dry eyes   . Headache(784.0)    occasional migraine  . Hot flashes due to tamoxifen 06/23/2013  . Hypothyroidism    synthroid  . Rosacea   . Sinus infection    recent sinus infection -on clindamycin 150mg  q6 hr    Current Outpatient Medications on File Prior to Visit  Medication Sig Dispense Refill  . albuterol (VENTOLIN HFA) 108 (90 Base) MCG/ACT inhaler Inhale 2 puffs into the lungs every 6 (six) hours as needed for wheezing or shortness of breath. 18 g 1  . atorvastatin (LIPITOR) 40 MG tablet Take 1 tablet (40 mg total) by mouth  daily. 90 tablet 3  . Calcium Carb-Cholecalciferol (CALCIUM 1000 + D PO) Take 1 tablet by mouth daily. Vitamin D 1,000IU    . cetirizine (ZYRTEC) 10 MG tablet Take by mouth.    . Cholecalciferol (VITAMIN D) 2000 UNITS tablet Take 4,000 Units by mouth daily.    Marland Kitchen escitalopram (LEXAPRO) 10 MG tablet TAKE 1 TABLET BY MOUTH ONCE DAILY FOR HOT FLASHES 90 tablet 3  . ibuprofen (ADVIL,MOTRIN) 600 MG tablet Take 600 mg by mouth every 8 (eight) hours as needed. For pain     . levothyroxine (SYNTHROID) 88 MCG tablet Take 1 tablet (88 mcg total) by mouth daily before breakfast. 90 tablet 0  . Omega-3 Fatty Acids (FISH OIL) 1000 MG CAPS Take 2,000 mg by mouth 2 (two) times daily.      No current facility-administered medications on file prior to visit.    Past Surgical History:  Procedure Laterality Date  . BREAST BIOPSY Right 2010  . BREAST LUMPECTOMY Left 2010  . BREAST SURGERY     left lumpectomy-2010  . FUNCTIONAL ENDOSCOPIC SINUS SURGERY     repair deviated septum-2007  . HAND SURGERY  x 2  (2007, 2008)   R hand (dog and cat bites) I&D  . LASER ABLATION     2008; endometrial  . supracervical hysterectomy and LSO  Da Vinci robot; done for fibroids  . TONSILLECTOMY     age 18    Allergies  Allergen Reactions  . Aleve [Naproxen Sodium] Hives  . Erythromycin     REACTION: GI Intolerance  . Penicillins     REACTION: steven's johnson syndrome  . Succinylcholine Other (See Comments)    Severe myalgias  . Sulfonamide Derivatives     REACTION: rash    Social History   Socioeconomic History  . Marital status: Single    Spouse name: Not on file  . Number of children: Not on file  . Years of education: Not on file  . Highest education level: Not on file  Occupational History  . Not on file  Tobacco Use  . Smoking status: Never Smoker  . Smokeless tobacco: Never Used  Substance and Sexual Activity  . Alcohol use: Yes    Comment: 0-1 per week.  . Drug use: No  . Sexual  activity: Yes    Partners: Female  Other Topics Concern  . 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  Lives with female partner, dog and cat   Social Determinants of Health   Financial Resource Strain:   . Difficulty of Paying Living Expenses:   Food Insecurity:   . Worried About Charity fundraiser in the Last Year:   . Arboriculturist in the Last Year:   Transportation Needs:   . Film/video editor (Medical):   Marland Kitchen Lack of Transportation (Non-Medical):   Physical Activity:   . Days of Exercise per Week:   . Minutes of Exercise per Session:   Stress:   . Feeling of Stress :   Social Connections:   . Frequency of Communication with Friends and Family:   . Frequency of Social Gatherings with Friends and Family:   . Attends Religious Services:   . Active Member of Clubs or Organizations:   . Attends Archivist Meetings:   Marland Kitchen Marital Status:   Intimate Partner Violence:   . Fear of Current or Ex-Partner:   . Emotionally Abused:   Marland Kitchen Physically Abused:   . Sexually Abused:     Family History  Problem Relation Age of Onset  . Hyperlipidemia Mother   . Heart disease Mother   . Breast cancer Mother 76  . Hypertension Mother   . Alcohol abuse Mother   . Asthma Sister   . Hyperlipidemia Maternal Grandmother   . Vision loss Maternal Grandmother        temporal arteritis  . Diabetes Maternal Grandmother        from steroids for TA  . Bipolar disorder Sister        schizoaffective bipolar  . Asthma Sister   . COPD Maternal Grandfather   . Cancer Maternal Uncle        colon cancer in 50's    BP 106/72   Ht 5\' 5"  (1.651 m)   Wt 148 lb (67.1 kg)   BMI 24.63 kg/m   Review of Systems: See HPI above.     Objective:  Physical Exam:  Gen: NAD, comfortable in exam room  Left knee: No gross deformity, ecchymoses, effusion. TTP lateral joint line.  No medial joint line, post patellar facet, hamstring, IT band tenderness. FROM with  5/5 strength flexion and extension. Negative ant/post drawers. Negative valgus/varus testing. Negative lachmans. Negative mcmurrays, apleys, bounce, patellar apprehension.  Positive thessalys. NV intact distally.  Gait: normal  with old and new shoes without abnormalities.  No excessive supination.   MSK u/s left knee:  No effusion.  Very mild arthropathy medial compartment.  Visualized portions of lateral meniscus without evidence of tear. Assessment & Plan:  1. Left knee pain - ultrasound is reassuring though exam suggests irritation of lateral meniscus vs small non-visualized tear.  Should do well with conservative treatment.  Quad strengthening exercises, icing, tylenol if needed.  Avoid squats, lunges, twisting.  Sleeve.  F/u in 5-6 weeks.

## 2020-03-21 DIAGNOSIS — M222X2 Patellofemoral disorders, left knee: Secondary | ICD-10-CM | POA: Diagnosis not present

## 2020-03-21 DIAGNOSIS — M25562 Pain in left knee: Secondary | ICD-10-CM | POA: Diagnosis not present

## 2020-04-05 ENCOUNTER — Ambulatory Visit: Payer: 59 | Admitting: Family Medicine

## 2020-04-06 ENCOUNTER — Other Ambulatory Visit: Payer: Self-pay | Admitting: Family Medicine

## 2020-04-06 ENCOUNTER — Encounter: Payer: Self-pay | Admitting: Genetic Counselor

## 2020-04-06 DIAGNOSIS — Z1231 Encounter for screening mammogram for malignant neoplasm of breast: Secondary | ICD-10-CM

## 2020-04-11 ENCOUNTER — Ambulatory Visit: Payer: 59 | Attending: Orthopedic Surgery | Admitting: Physical Therapy

## 2020-04-11 ENCOUNTER — Encounter: Payer: Self-pay | Admitting: Physical Therapy

## 2020-04-11 ENCOUNTER — Other Ambulatory Visit: Payer: Self-pay

## 2020-04-11 DIAGNOSIS — G8929 Other chronic pain: Secondary | ICD-10-CM | POA: Diagnosis not present

## 2020-04-11 DIAGNOSIS — R2689 Other abnormalities of gait and mobility: Secondary | ICD-10-CM | POA: Diagnosis not present

## 2020-04-11 DIAGNOSIS — M25562 Pain in left knee: Secondary | ICD-10-CM | POA: Insufficient documentation

## 2020-04-12 ENCOUNTER — Encounter: Payer: Self-pay | Admitting: Physical Therapy

## 2020-04-12 NOTE — Therapy (Signed)
[place], [place], [address] Phone: [phone]   Fax:  [phone]  Physical Therapy Evaluation  Patient Details  Name: Amanda Jensen MRN: [id number] Date of Birth: 15-Feb-1966 Referring Provider (PT): Dr Lyndel Pleasure    Encounter Date: 04/11/2020   PT End of Session - 04/11/20 1640    Visit Number 1    Number of Visits 12    Date for PT Re-Evaluation 05/23/20    Authorization Type [facility] U[facility]    PT Start Time 1630    PT Stop Time 1714    PT Time Calculation (min) 44 min    Activity Tolerance Patient tolerated treatment well    Behavior During Therapy [facility] for tasks assessed/performed           Past Medical History:  Diagnosis Date  . Allergy history unknown    seasonal-on zyrtec  . Cancer [facility])    history of left breast cancer-on tamoxifen  . Complication of anesthesia    succinyl choline reacttion-intense muscle a ches  . Constipation    hypothyroidism related(goes away with med adjustment)  . Depression    h/o-not currently on med  . Dry eyes   . Headache(784.0)    occasional migraine  . Hot flashes due to tamoxifen 06/23/2013  . Hypothyroidism    synthroid  . Rosacea   . Sinus infection    recent sinus infection -on clindamycin 150mg  q6 hr    Past Surgical History:  Procedure Laterality Date  . BREAST BIOPSY Right 2010  . BREAST LUMPECTOMY Left 2010  . BREAST SURGERY     left lumpectomy-2010  . FUNCTIONAL ENDOSCOPIC SINUS SURGERY     repair deviated septum-2007  . HAND SURGERY  x 2  (2007, 2008)   R hand (dog and cat bites) I&D  . LASER ABLATION     2008; endometrial  . supracervical hysterectomy and LSO     Da Vinci robot; done for fibroids  . TONSILLECTOMY     age 54    There were no vitals filed for this visit.    Subjective Assessment - 04/12/20 1454    Subjective Patient has had knee pain for over a year. She was running up to 400 miles a year when she  began to have pain in her knee. She tried running on the beach and it exaceberated her symptoms about 4 months ago. She has been to asports med MD and an ortho doc. Her ultrasound showed meiscal irritation v tear . She feels popping gfrom time to time. She has not ran for 2.5 moinths but would like to get back to it.    Pertinent History cancer, depresion,    How long can you sit comfortably? no limit    How long can you stand comfortably? no limit    How long can you walk comfortably? pain only with running    Diagnostic tests diagnostic ultasound(-) for meniscal pathology    Patient Stated Goals to return to running    Currently in Pain? Yes   only when running   Pain Score 6     Pain Location Knee    Pain Orientation Left;Lateral    Pain Descriptors / Indicators Aching    Pain Type Chronic pain    Pain Onset More than a month ago    Pain Frequency Constant    Aggravating Factors  running    Pain Relieving Factors rest    Effect of  Pain on Daily Activities unable to run for exercise              [facility] PT Assessment - 04/12/20 0001      Assessment   Medical Diagnosis Left Knee Pain    Referring Provider (PT) Dr Lyndel Pleasure     Onset Date/Surgical Date --   1 year    Hand Dominance Right    Next MD Visit 6-8 weeks       Precautions   Precautions None      Restrictions   Weight Bearing Restrictions No      Balance Screen   Has the patient fallen in the past 6 months No    Has the patient had a decrease in activity level because of a fear of falling?  No    Is the patient reluctant to leave their home because of a fear of falling?  No      Prior Function   Level of Independence Independent    Leisure running       Cognition   Overall Cognitive Status Within Functional Limits for tasks assessed    Attention Focused      Observation/Other Assessments   Observations bilateral flat foot, bilateral knee valgus L=R; lateral wear on bilateral shows potentially  from over correcting pronation. Has new shoes but feels like they make her foot cramp      Observation/Other Assessments-Edema    Edema --   no noticable edema     Sensation   Light Touch Appears Intact      Coordination   Gross Motor Movements are Fluid and Coordinated Yes    Fine Motor Movements are Fluid and Coordinated Yes      Functional Tests   Functional tests Squat;Single leg stance      Squat   Comments bilateral valgus; knees come forward bilaterally but not significantly.       Single Leg Stance   Comments good on the right, decreased on the leg left       PROM   Overall PROM Comments Normal hip PROM       Strength   Overall Strength Comments 5/5 gross bilateral UE and LE. Decreased abdomianl contraction       Flexibility   Soft Tissue Assessment /Muscle Length yes    Hamstrings bilateral tight hamstrings    ITB no noticable tightness of the IT bands (-) ober       Palpation   Patella mobility crepitus with patella motion     Palpation comment no signifcant tenderness to palpation ogf the knees                       Objective measurements completed on examination: See above findings.       [facility] Adult PT Treatment/Exercise - 04/12/20 0001      Knee/Hip Exercises: Standing   Other Standing Knee Exercises single leg stance 3x30 sec hold     Other Standing Knee Exercises lateral band walk 2x10 green. Given red band as well       Knee/Hip Exercises: Supine   Other Supine Knee/Hip Exercises bridge with band green 2x10; eccentric straight leg raise. 2x10                   PT Education - 04/12/20 1525    Education Details improtance of single leg stance. otential shoe inserts and proper shoes    Person(s) Educated Patient  Methods Explanation;Demonstration;Tactile cues;Verbal cues    Comprehension Verbalized understanding;Returned demonstration;Verbal cues required;Tactile cues required            PT Short Term Goals - 04/11/20  1749      PT SHORT TERM GOAL #1   Title Patient will increase single leg stance time to 30 sec    Time 3    Period Weeks    Status New    Target Date 05/02/20      PT SHORT TERM GOAL #2   Title Patient will be indepdnet with basic stabilization exercise program    Time 3    Period Weeks    Status New    Target Date 05/02/20      PT SHORT TERM GOAL #3   Title Therapy will review FOTO results    Time 3    Period Weeks    Status New    Target Date 05/02/20             PT Long Term Goals - 04/11/20 1751      PT LONG TERM GOAL #1   Title Patient will run 2 miles without self report of pain    Time 6    Period Weeks    Status New    Target Date 05/23/20      PT LONG TERM GOAL #2   Title Patient will demonstrate good dynamic stability of the left leg without pain    Time 6    Period Weeks    Status New    Target Date 05/23/20                  Plan - 04/12/20 1527    Clinical Impression Statement Patient is a 54 year old female who presents with left knee pain with running. She has flat foot and bilateral pronation in standing. Her wear pattern on her shoes shows functional supination. Therapy was unable to perfrom running analysis this visit but will next visit. She has decreased left sigle leg stance compared to the right. . She would benefit from skilled therapy to improve ability to return to running.    Examination-Activity Limitations --   running   Examination-Participation Restrictions Community Activity    Stability/Clinical Decision Making Stable/Uncomplicated    Clinical Decision Making Low    Rehab Potential Excellent    PT Frequency 1x / week    PT Duration 6 weeks    PT Treatment/Interventions ADLs/Self Care Home Management;Cryotherapy;Electrical Stimulation;Moist Heat;Ultrasound;Gait training;Stair training;Functional mobility training;Therapeutic activities;Therapeutic exercise;Neuromuscular re-education;Manual techniques;Dry needling;Taping     PT Next Visit Plan running assessment, continue with signgle leg stability training ; modalites PRN    PT Home Exercise Plan brdge with band; eccentric heel raise, lateral band walk; single leg stance with progrsssion to functional reach.    Consulted and Agree with Plan of Care Patient           Patient will benefit from skilled therapeutic intervention in order to improve the following deficits and impairments:  Abnormal gait, Decreased endurance, Decreased strength, Pain, Decreased range of motion  Visit Diagnosis: Chronic pain of left knee  Other abnormalities of gait and mobility     Problem List Patient Active Problem List   Diagnosis Date Noted  . Hot flashes due to menopause 10/09/2017  . Arthritis 10/09/2017  . Family history of heart disease in female family member before age 51 03/07/2016  . History of breast cancer in female 04/13/2009  . Hypothyroidism 04/13/2009  .  Exercise-induced asthma 04/13/2009  . Hemorrhoids 04/07/2009  . Allergic rhinitis 04/07/2009    Carney Living PT DPT  04/12/2020, 4:23 PM  [facility] [address] [place], [place], [address] Phone: [phone]   Fax:  [phone]  Name: Amanda Jensen MRN: [id number] Date of Birth: [birth year]

## 2020-04-14 ENCOUNTER — Other Ambulatory Visit: Payer: Self-pay

## 2020-04-14 ENCOUNTER — Ambulatory Visit
Admission: RE | Admit: 2020-04-14 | Discharge: 2020-04-14 | Disposition: A | Discharge status: Home / Self Care | Payer: 59 | Source: Ambulatory Visit | Attending: Family Medicine | Admitting: Family Medicine

## 2020-04-14 DIAGNOSIS — Z1231 Encounter for screening mammogram for malignant neoplasm of breast: Secondary | ICD-10-CM

## 2020-04-28 ENCOUNTER — Other Ambulatory Visit: Payer: Self-pay

## 2020-04-28 ENCOUNTER — Encounter: Payer: Self-pay | Admitting: Physical Therapy

## 2020-04-28 ENCOUNTER — Ambulatory Visit: Payer: 59 | Attending: Orthopedic Surgery | Admitting: Physical Therapy

## 2020-04-28 DIAGNOSIS — R2689 Other abnormalities of gait and mobility: Secondary | ICD-10-CM | POA: Diagnosis not present

## 2020-04-28 DIAGNOSIS — M25562 Pain in left knee: Secondary | ICD-10-CM | POA: Insufficient documentation

## 2020-04-28 DIAGNOSIS — G8929 Other chronic pain: Secondary | ICD-10-CM | POA: Diagnosis not present

## 2020-04-28 NOTE — Therapy (Addendum)
[place], [place], [address] Phone: [phone]   Fax:  [phone]  Physical Therapy Treatment/Discharge   Patient Details  Name: Amanda Jensen MRN: [id number] Date of Birth: [birth year] Referring Provider (PT): Dr Lyndel Pleasure    Encounter Date: 04/28/2020   PT End of Session - 04/28/20 0857    Visit Number 2    Number of Visits 12    Date for PT Re-Evaluation 05/23/20    Authorization Type [facility]    PT Start Time 0800    PT Stop Time 0846    PT Time Calculation (min) 46 min    Activity Tolerance Patient tolerated treatment well    Behavior During Therapy [facility] for tasks assessed/performed           Past Medical History:  Diagnosis Date  . Allergy history unknown    seasonal-on zyrtec  . Cancer [facility])    history of left breast cancer-on tamoxifen  . Complication of anesthesia    succinyl choline reacttion-intense muscle a ches  . Constipation    hypothyroidism related(goes away with med adjustment)  . Depression    h/o-not currently on med  . Dry eyes   . Headache(784.0)    occasional migraine  . Hot flashes due to tamoxifen 06/23/2013  . Hypothyroidism    synthroid  . Rosacea   . Sinus infection    recent sinus infection -on clindamycin 124m q6 hr    Past Surgical History:  Procedure Laterality Date  . BREAST BIOPSY Right 2010  . BREAST LUMPECTOMY Left 2010  . BREAST SURGERY     left lumpectomy-2010  . FUNCTIONAL ENDOSCOPIC SINUS SURGERY     repair deviated septum-2007  . HAND SURGERY  x 2  (2007, 2008)   R hand (dog and cat bites) I&D  . LASER ABLATION     2008; endometrial  . supracervical hysterectomy and LSO     Da Vinci robot; done for fibroids  . TONSILLECTOMY     age 54   There were no vitals filed for this visit.   Subjective Assessment - 04/28/20 0810    Subjective Patient ran with her new shoes and had significant pain in her lateral knee. She  was able to complete other exercise tasks without pain.    Pertinent History cancer, depresion,    How long can you sit comfortably? no limit    How long can you stand comfortably? no limit    How long can you walk comfortably? pain only with running    Diagnostic tests diagnostic ultasound(-) for meniscal pathology    Patient Stated Goals to return to running    Currently in Pain? Yes    Pain Score 6     Pain Location Knee    Pain Orientation Left    Pain Descriptors / Indicators Aching    Pain Type Chronic pain    Pain Radiating Towards when running    Pain Onset More than a month ago    Pain Frequency Constant    Aggravating Factors  running    Pain Relieving Factors rest    Effect of Pain on Daily Activities unable to run for exercises                             [facility] 0001      Ambulation/Gait   Gait Comments running  analysis: significant lateral strike with end whip noted on the right. Increased with distance.       Self-Care   Self-Care Other Self-Care Comments    Other Self-Care Comments  reviewed proper shoe for pronation. Patient feels like she is over corrected in her shoe. Despite her falt foot she may need more of a neutral shoe       Knee/Hip Exercises: Standing   Gait Training fo    Other Standing Knee Exercises single leg stance 3x30 sec hold on air-ex     Other Standing Knee Exercises foward hop onto left leg 2x10 pain on the left; lateral hop 2x10       Knee/Hip Exercises: Supine   Other Supine Knee/Hip Exercises single leg bridge 2x15; SLR 2x15 2lb weight       Manual Therapy   Manual Therapy Taping    McConnell taped knee medial. Reviewed how to tape the knee and where to buy the tape.                   PT Education - 04/28/20 0856    Education Details advanced exercises    Person(s) Educated Patient    Methods Explanation;Demonstration;Tactile cues;Verbal cues    Comprehension  Verbalized understanding;Returned demonstration;Verbal cues required;Tactile cues required            PT Short Term Goals - 04/11/20 1749      PT SHORT TERM GOAL #1   Title Patient will increase single leg stance time to 30 sec    Time 3    Period Weeks    Status New    Target Date 05/02/20      PT SHORT TERM GOAL #2   Title Patient will be indepdnet with basic stabilization exercise program    Time 3    Period Weeks    Status New    Target Date 05/02/20      PT SHORT TERM GOAL #3   Title Therapy will review FOTO results    Time 3    Period Weeks    Status New    Target Date 05/02/20             PT Long Term Goals - 04/11/20 1751      PT LONG TERM GOAL #1   Title Patient will run 2 miles without self report of pain    Time 6    Period Weeks    Status New    Target Date 05/23/20      PT LONG TERM GOAL #2   Title Patient will demonstrate good dynamic stability of the left leg without pain    Time 6    Period Weeks    Status New    Target Date 05/23/20                 Plan - 04/28/20 1031    Clinical Impression Statement The patient wore her shoes today that are giving her a problem. she feels like there is too much arch correctrion. They are also a pretty rigid shoe. Upon runnin analysis it was found that she is landing laterally on the left side vs the right. On the right she lands on her toe in a good neutral position. On the left she lands on her lateral foot with whip just before strike. Her single leg stance has improved significantly despite no improvement in pain with running. She will be seeing an Orthpedic MD on Monday. We will proceed per  ortho reccomendation.    Examination-Participation Restrictions Community Activity    Stability/Clinical Decision Making Stable/Uncomplicated    Clinical Decision Making Low    Rehab Potential Excellent    PT Frequency 1x / week    PT Duration 6 weeks    PT Treatment/Interventions ADLs/Self Care Home  Management;Cryotherapy;Electrical Stimulation;Moist Heat;Ultrasound;Gait training;Stair training;Functional mobility training;Therapeutic activities;Therapeutic exercise;Neuromuscular re-education;Manual techniques;Dry needling;Taping    PT Next Visit Plan running assessment, continue with signgle leg stability training ; modalites PRN    PT Home Exercise Plan brdge with band; eccentric heel raise, lateral band walk; single leg stance with progrsssion to functional reach.    Consulted and Agree with Plan of Care Patient           Patient will benefit from skilled therapeutic intervention in order to improve the following deficits and impairments:  Abnormal gait, Decreased endurance, Decreased strength, Pain, Decreased range of motion  Visit Diagnosis: Chronic pain of left knee  Other abnormalities of gait and mobility  PHYSICAL THERAPY DISCHARGE SUMMARY  Visits from Start of Care: 2  Current functional level related to goals / functional outcomes: Improved signle leg stance but continued pain   Remaining deficits: Continued pain. Patient went bacl to MD   Education / Equipment: Had HEP   Plan: Patient agrees to discharge.  Patient goals were not met. Patient is being discharged due to lack of progress.  ?????       Problem List Patient Active Problem List   Diagnosis Date Noted  . Hot flashes due to menopause 10/09/2017  . Arthritis 10/09/2017  . Family history of heart disease in female family member before age 34 03/07/2016  . History of breast cancer in female 04/13/2009  . Hypothyroidism 04/13/2009  . Exercise-induced asthma 04/13/2009  . Hemorrhoids 04/07/2009  . Allergic rhinitis 04/07/2009    Carney Living  PT DPT  04/28/2020, 12:34 PM  [facility] [address] [place], [place], [address] Phone: [phone]   Fax:  [phone]  Name: Cydnie Deason Couper MRN: [id number] Date of Birth:  Sep 23, 1965

## 2020-05-01 DIAGNOSIS — M222X2 Patellofemoral disorders, left knee: Secondary | ICD-10-CM | POA: Diagnosis not present

## 2020-05-01 DIAGNOSIS — M25562 Pain in left knee: Secondary | ICD-10-CM | POA: Diagnosis not present

## 2020-05-09 DIAGNOSIS — M25562 Pain in left knee: Secondary | ICD-10-CM | POA: Diagnosis not present

## 2020-05-15 DIAGNOSIS — S83282A Other tear of lateral meniscus, current injury, left knee, initial encounter: Secondary | ICD-10-CM | POA: Diagnosis not present

## 2020-06-05 ENCOUNTER — Other Ambulatory Visit: Payer: Self-pay | Admitting: Family Medicine

## 2020-06-05 DIAGNOSIS — E039 Hypothyroidism, unspecified: Secondary | ICD-10-CM

## 2020-06-05 DIAGNOSIS — E782 Mixed hyperlipidemia: Secondary | ICD-10-CM

## 2020-06-05 MED ORDER — ATORVASTATIN CALCIUM 40 MG PO TABS
40.0000 mg | ORAL_TABLET | Freq: Every day | ORAL | 3 refills | Status: DC
Start: 2020-06-05 — End: 2021-09-11

## 2020-06-05 MED ORDER — LEVOTHYROXINE SODIUM 88 MCG PO TABS: 88.0000 ug | ORAL_TABLET | Freq: Every day | ORAL | 0 refills | Status: DC

## 2020-06-05 MED FILL — ATORVASTATIN 40 MG TABLET: 40 | 90 days supply | Qty: 90 | Fill #0

## 2020-06-05 MED FILL — ESCITALOPRAM 10 MG TABLET: 10 | 90 days supply | Qty: 90 | Fill #2

## 2020-06-05 MED FILL — LEVOTHYROXINE 88 MCG TABLET: 88 | 90 days supply | Qty: 90 | Fill #0

## 2020-06-05 NOTE — Progress Notes (Signed)
I called him Synthroid for her.  Also renewed Lipitor.  Left her message to call so we can get a TSH.

## 2020-06-12 ENCOUNTER — Encounter: Payer: Self-pay | Admitting: Physical Therapy

## 2020-09-15 ENCOUNTER — Other Ambulatory Visit: Payer: Self-pay | Admitting: Family Medicine

## 2020-09-15 DIAGNOSIS — E039 Hypothyroidism, unspecified: Secondary | ICD-10-CM

## 2020-09-15 MED ORDER — LEVOTHYROXINE SODIUM 88 MCG PO TABS: 88.0000 ug | ORAL_TABLET | Freq: Every day | ORAL | 0 refills | Status: DC

## 2020-09-15 MED FILL — LEVOTHYROXINE 88 MCG TABLET: 88 | 90 days supply | Qty: 90 | Fill #0

## 2020-09-15 MED FILL — ESCITALOPRAM 10 MG TABLET: 10 | 90 days supply | Qty: 90 | Fill #3

## 2020-09-15 NOTE — Progress Notes (Signed)
She called for refill on her Synthroid.  The last TSH was in 2019.  Recommend that she come in for blood work.

## 2020-10-03 ENCOUNTER — Telehealth: Payer: Self-pay | Admitting: Internal Medicine

## 2020-10-03 NOTE — Telephone Encounter (Signed)
Patient asked if she is due for any vaccines for upcoming trip to [place].   Will have her come in for typhoid injection, malaria prophylaxis.

## 2020-11-09 ENCOUNTER — Ambulatory Visit (INDEPENDENT_AMBULATORY_CARE_PROVIDER_SITE_OTHER): Payer: 59 | Admitting: Internal Medicine

## 2020-11-09 ENCOUNTER — Other Ambulatory Visit: Payer: Self-pay | Admitting: Internal Medicine

## 2020-11-09 ENCOUNTER — Other Ambulatory Visit: Payer: Self-pay

## 2020-11-09 DIAGNOSIS — Z9189 Other specified personal risk factors, not elsewhere classified: Secondary | ICD-10-CM

## 2020-11-09 DIAGNOSIS — Z23 Encounter for immunization: Secondary | ICD-10-CM | POA: Diagnosis not present

## 2020-11-09 DIAGNOSIS — Z7184 Encounter for health counseling related to travel: Secondary | ICD-10-CM

## 2020-11-09 MED ORDER — ATOVAQUONE-PROGUANIL HCL 250-100 MG PO TABS: 1.0000 | ORAL_TABLET | Freq: Every day | ORAL | 0 refills | Status: DC

## 2020-11-09 MED ORDER — AZITHROMYCIN 500 MG PO TABS: 500.0000 mg | ORAL_TABLET | Freq: Every day | ORAL | 0 refills | Status: DC

## 2020-11-09 MED FILL — AZITHROMYCIN 500 MG TABLET: 500 | 5 days supply | Qty: 5 | Fill #0

## 2020-11-09 MED FILL — ATOVAQUONE-PROGUANIL 250-10: 250-100 | 21 days supply | Qty: 21 | Fill #0

## 2020-11-09 NOTE — Addendum Note (Signed)
Addended by: Landis Gandy on: 11/09/2020 05:19 PM   Modules accepted: Orders

## 2020-11-09 NOTE — Patient Instructions (Signed)
[facility]                [address]                   [place], [place] [address]                      Phone: [phone]                        Fax: [phone]   Planned departure date: may 28        Planned return date: June 10th Countries of travel: [place], [place]  Guidelines for the Prevention & Treatment of Traveler's Diarrhea  Prevention: "Boil it, Peel it, Lacinda Axon it, or Forget it"   the fewer chances -> lower risk: try to stick to food & water precautions as much as possible"   If it's "piping hot"; it is probably okay, if not, it may not be   Treatment   1) You should always take care to drink lots of fluids in order to avoid dehydration   2) You should bring medications with you in case you come down with a case of diarrhea   3) OTC = bring pepto-bismol - can take with initial abdominal symptoms;                    Imodium - can help slow down your intestinal tract, can help relief cramps                    and diarrhea, can take if no bloody diarrhea  Use azithromycin if needed for traveler's diarrhea  Guidelines for the Prevention of Malaria  Avoidance:  -fewer mosquito bites = lower risk. Mosquitos can bite at night as well as daytime  -cover up (long sleeve clothing), mosquito nets, screens  -Insect repellent for your skin ( DEET containing lotion > 20%): for clothes ( permethrin spray) www.insectshield.com 872 547 6992) for pretreated permethrin  On may 26, start malarone for malaria prevention.   Immunizations received today: typhoid vaccine  Future immunizations, if indicated   Prior to travel:  1) Be sure to pick up appropriate prescriptions, including medicine you take daily. Do not expect to be able to fill your prescriptions abroad.  2) check country guidance for covid testing requirements 3) Strongly consider obtaining traveler's insurance, including emergency evacuation  insurance. Most plans in the [place] do not cover participants abroad. (see below for resources)  4) Register at the appropriate [place] embassy or consulate with travel dates so they are aware of your presence in-country and for helpful advice during travel using the [employer] (STEP, GreenNylon.com.cy).  5) Leave contact information with a relative or friend.  6) Keep a Research officer, political party, credit cards in case they become lost or stolen  7) Inform your credit card company that you will be travelling abroad   During travel:  1) If you become ill and need medical advice, the [place] [employer]	[place] of the country you are traveling in general provides a list of [place] speaking doctors.  We are also available on MyChart for remote consultation if you register prior to travel. 2) Avoid motorcycles or scooters when at all possible. Traffic laws in many countries are lax and accidents occur frequently.  3) Do not take any unnecessary risks that you wouldn't do at  home.   Resources:  -Country specific information: BlindResource.ca or GreenNylon.com.cy  -Press photographer (DEET, mosquito nets): [employer], [employer] store, [employer], Stewart insurance options: gatewayplans.com; http://clayton-rivera.info/; travelguard.com or Good [employer], gninsurance.com or info@gninsurance .com, [id number].   Post Travel:  If you return from your trip ill, call your primary care doctor or our travel clinic @ [phone].   Enjoy your trip and know that with proper pre-travel preparation, most people have an enjoyable and uninterrupted trip!

## 2020-11-09 NOTE — Progress Notes (Signed)
Subjective:   Amanda Jensen is a 55 y.o. female who presents to the Infectious Disease clinic for travel consultation. Planned departure date: may 28         Planned return date: June 9 Countries of travel: [place], [place] Areas in country: rural and urban Accommodations: hotel and camp Purpose of travel: vacation Prior travel out of [place]: yes. Extensive- [place], [place], [place], [place].     Objective:   Medications:reviewed Allergies- erythromycin - gi upset   Assessment:   No contraindications to travel. none     Plan:   traveler's diarrhea =azithromycin 500mg  x 5 tabs if needed for TD (which can tolerate despite erythromycin intolerance)  Malaria proph = malarone plus gave precautions regarding deet/premethrin  Travel vaccine = uptodate with exception to typhoid injection booster

## 2020-11-18 ENCOUNTER — Other Ambulatory Visit (HOSPITAL_COMMUNITY): Payer: Self-pay

## 2020-12-27 ENCOUNTER — Other Ambulatory Visit (HOSPITAL_COMMUNITY): Payer: Self-pay

## 2020-12-27 MED ORDER — LEVOTHYROXINE SODIUM 88 MCG PO TABS
88.0000 ug | ORAL_TABLET | Freq: Every day | ORAL | 3 refills | Status: DC
  Filled 2020-12-27: qty 90, 90d supply, fill #0

## 2020-12-27 MED FILL — Azithromycin Tab 500 MG: ORAL | 5 days supply | Qty: 5 | Fill #0 | Status: AC

## 2020-12-27 MED FILL — Atovaquone-Proguanil HCl Tab 250-100 MG: ORAL | 21 days supply | Qty: 21 | Fill #0 | Status: AC

## 2020-12-29 ENCOUNTER — Other Ambulatory Visit: Payer: Self-pay

## 2020-12-29 ENCOUNTER — Ambulatory Visit (INDEPENDENT_AMBULATORY_CARE_PROVIDER_SITE_OTHER): Payer: 59 | Admitting: Family Medicine

## 2020-12-29 DIAGNOSIS — M25562 Pain in left knee: Secondary | ICD-10-CM

## 2020-12-29 NOTE — Assessment & Plan Note (Signed)
History, presentation, exam, and ultrasound all consistent with patella tendonitis. - HEP to be done in conjunction with compression sleeve, activity modification (avoid downhills and lots of steps and jumps) - RICE as needed - K-tape with compression sleeve while running - F/u in 4 weeks if no improvement

## 2020-12-29 NOTE — Progress Notes (Signed)
[facility]: Attending Note: I have reviewed the chart, discussed wit the Sports Medicine Fellow. I agree with assessment and treatment plan as detailed in the Fellow's note.  

## 2020-12-29 NOTE — Progress Notes (Signed)
    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 She presents today with anterior knee pain located at the inferior patella while running. She notices most when running downhill but has also noticed it when going up or down stairs. She has tried to be more active lately and the pain is transient and doesn't last long. No falls or trauma and no feelings of instability, locking, catching, or giving way. No night time pain or preventing her from sleep.  PERTINENT  PMH / PSH: Hypothroidism  OBJECTIVE:   BP 102/64   Ht 5' 4.5" (1.638 m)   Wt 153 lb (69.4 kg)   BMI 25.86 kg/m   [facility] 12/29/2020  Frequency of aerobic exercise (# of days/week) 4  Average time in minutes 45  Frequency of strengthening activities (# of days/week) 2   Knee, Left: Inspection was negative for erythema, ecchymosis, and effusion. No obvious bony abnormalities or signs of osteophyte development. Palpation yielded no asymmetric warmth; No joint line tenderness; TTP at the inferior pole of the patella over the patella tendon. No other TTP. ROM normal in flexion (135 degrees) and extension (0 degrees). Normal hamstring and quadriceps strength. Neurovascularly intact bilaterally. Special Tests  - Cruciate Ligaments:   - Anterior Drawer:  NEG - Posterior Drawer: NEG  - Collateral Ligaments:   - Varus/Valgus Stress test: NEG  - Meniscus:   - McMurray's: NEG  - Patella:   - Patellar grind/compression: NEG   - Patellar glide: Without apprehension  Limited MSK U/S: Left Knee Some mild thickening at the origination of the patella tendon. No patellar burisitis seen. No tearing or fraying or calcifications seen at the originatio of the patella tendon. Impression: Mild thickening at the origination of the patella tendon consistent with patella tendonitis   ASSESSMENT/PLAN:   Left knee pain History, presentation, exam, and  ultrasound all consistent with patella tendonitis. - HEP to be done in conjunction with compression sleeve, activity modification (avoid downhills and lots of steps and jumps) - RICE as needed - K-tape with compression sleeve while running - F/u in 4 weeks if no improvement     Nuala Alpha, DO PGY-4, [facility] [place]

## 2020-12-29 NOTE — Patient Instructions (Signed)
It was great to meet you today! Thank you for letting me participate in your care!  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 I recommend you try taping along with a compression sleeve. Please complete the at home exercises and return in 4 weeks to ensure you are getting better.  You can always do icing and take OTC medications such as Tylenol, NSAIDs, and use Voltaren gel to help with pain and inflammation.  Be well, Harolyn Rutherford, DO PGY-4, [facility] [place]

## 2021-02-05 DIAGNOSIS — M65312 Trigger thumb, left thumb: Secondary | ICD-10-CM | POA: Diagnosis not present

## 2021-02-05 DIAGNOSIS — M79642 Pain in left hand: Secondary | ICD-10-CM | POA: Diagnosis not present

## 2021-02-16 ENCOUNTER — Telehealth: Payer: Self-pay

## 2021-02-16 ENCOUNTER — Other Ambulatory Visit (HOSPITAL_COMMUNITY): Payer: Self-pay

## 2021-02-16 ENCOUNTER — Other Ambulatory Visit: Payer: Self-pay | Admitting: Family Medicine

## 2021-02-16 DIAGNOSIS — E039 Hypothyroidism, unspecified: Secondary | ICD-10-CM

## 2021-02-16 DIAGNOSIS — N951 Menopausal and female climacteric states: Secondary | ICD-10-CM

## 2021-02-16 MED ORDER — LEVOTHYROXINE SODIUM 88 MCG PO TABS
88.0000 ug | ORAL_TABLET | Freq: Every day | ORAL | 0 refills | Status: DC
  Filled 2021-02-16 – 2021-04-05 (×2): qty 90, 90d supply, fill #0

## 2021-02-16 MED ORDER — ESCITALOPRAM OXALATE 10 MG PO TABS
10.0000 mg | ORAL_TABLET | Freq: Every day | ORAL | 3 refills | Status: DC
  Filled 2021-02-16: qty 90, 90d supply, fill #0
  Filled 2021-05-29 (×2): qty 30, 30d supply, fill #1
  Filled 2021-05-29: qty 90, 90d supply, fill #1

## 2021-02-16 NOTE — Telephone Encounter (Signed)
Pt has CPE scheduled 11/22.  Needs refills Lexapro and Synthroid to [facility]

## 2021-02-18 DIAGNOSIS — Z20822 Contact with and (suspected) exposure to covid-19: Secondary | ICD-10-CM | POA: Diagnosis not present

## 2021-03-19 LAB — HM MAMMOGRAPHY

## 2021-03-27 ENCOUNTER — Other Ambulatory Visit: Payer: Self-pay | Admitting: Family Medicine

## 2021-03-27 DIAGNOSIS — Z1231 Encounter for screening mammogram for malignant neoplasm of breast: Secondary | ICD-10-CM

## 2021-04-05 ENCOUNTER — Other Ambulatory Visit (HOSPITAL_COMMUNITY): Payer: Self-pay

## 2021-04-16 ENCOUNTER — Other Ambulatory Visit: Payer: Self-pay

## 2021-04-16 ENCOUNTER — Ambulatory Visit
Admission: RE | Admit: 2021-04-16 | Discharge: 2021-04-16 | Disposition: A | Discharge status: Home / Self Care | Payer: 59 | Source: Ambulatory Visit | Attending: Family Medicine | Admitting: Family Medicine

## 2021-04-16 DIAGNOSIS — Z1231 Encounter for screening mammogram for malignant neoplasm of breast: Secondary | ICD-10-CM | POA: Diagnosis not present

## 2021-04-24 ENCOUNTER — Other Ambulatory Visit: Payer: Self-pay | Admitting: Family Medicine

## 2021-04-24 DIAGNOSIS — N6489 Other specified disorders of breast: Secondary | ICD-10-CM

## 2021-04-25 ENCOUNTER — Other Ambulatory Visit (HOSPITAL_COMMUNITY): Payer: Self-pay

## 2021-04-25 MED ORDER — ESTRADIOL 10 MCG VA TABS
10.0000 ug | ORAL_TABLET | VAGINAL | 0 refills | Status: DC
  Filled 2021-04-25: qty 4, 28d supply, fill #0
  Filled 2021-05-29 (×3): qty 8, 56d supply, fill #1

## 2021-04-30 ENCOUNTER — Other Ambulatory Visit: Payer: Self-pay | Admitting: Family Medicine

## 2021-04-30 DIAGNOSIS — R928 Other abnormal and inconclusive findings on diagnostic imaging of breast: Secondary | ICD-10-CM

## 2021-05-09 ENCOUNTER — Ambulatory Visit
Admission: RE | Admit: 2021-05-09 | Discharge: 2021-05-09 | Disposition: A | Discharge status: Home / Self Care | Payer: 59 | Source: Ambulatory Visit | Attending: Family Medicine | Admitting: Family Medicine

## 2021-05-09 ENCOUNTER — Other Ambulatory Visit: Payer: Self-pay | Admitting: Family Medicine

## 2021-05-09 ENCOUNTER — Other Ambulatory Visit: Payer: Self-pay

## 2021-05-09 DIAGNOSIS — R928 Other abnormal and inconclusive findings on diagnostic imaging of breast: Secondary | ICD-10-CM

## 2021-05-09 DIAGNOSIS — N6310 Unspecified lump in the right breast, unspecified quadrant: Secondary | ICD-10-CM

## 2021-05-09 DIAGNOSIS — N6313 Unspecified lump in the right breast, lower outer quadrant: Secondary | ICD-10-CM | POA: Diagnosis not present

## 2021-05-09 DIAGNOSIS — Z853 Personal history of malignant neoplasm of breast: Secondary | ICD-10-CM | POA: Diagnosis not present

## 2021-05-14 ENCOUNTER — Other Ambulatory Visit: Payer: Self-pay

## 2021-05-14 ENCOUNTER — Ambulatory Visit
Admission: RE | Admit: 2021-05-14 | Discharge: 2021-05-14 | Disposition: A | Discharge status: Home / Self Care | Payer: 59 | Source: Ambulatory Visit | Attending: Family Medicine | Admitting: Family Medicine

## 2021-05-14 DIAGNOSIS — N6310 Unspecified lump in the right breast, unspecified quadrant: Secondary | ICD-10-CM

## 2021-05-29 ENCOUNTER — Other Ambulatory Visit: Payer: Self-pay | Admitting: Family Medicine

## 2021-05-29 ENCOUNTER — Other Ambulatory Visit (HOSPITAL_COMMUNITY): Payer: Self-pay

## 2021-05-29 MED ORDER — ZOSTER VAC RECOMB ADJUVANTED 50 MCG/0.5ML IM SUSR
0.5000 mL | INTRAMUSCULAR | 1 refills | Status: DC
  Filled 2021-05-29 – 2021-06-04 (×4): qty 0.5, 1d supply, fill #0
  Filled 2021-08-06: qty 0.5, 1d supply, fill #1

## 2021-05-29 MED ORDER — INFLUENZA VAC SPLIT QUAD 0.5 ML IM SUSY
0.5000 mL | PREFILLED_SYRINGE | INTRAMUSCULAR | 0 refills | Status: DC
Start: 2021-05-29 — End: 2022-12-24
  Filled 2021-05-29: qty 0.5, 1d supply, fill #0

## 2021-05-29 MED ORDER — ATORVASTATIN CALCIUM 40 MG PO TABS
40.0000 mg | ORAL_TABLET | Freq: Every day | ORAL | 1 refills | Status: DC
  Filled 2021-05-29: qty 90, 90d supply, fill #0

## 2021-05-29 MED ORDER — ESTRADIOL 10 MCG VA TABS
10.0000 ug | ORAL_TABLET | VAGINAL | 4 refills | Status: AC
  Filled 2021-07-23: qty 4, 28d supply, fill #0
  Filled 2021-12-03: qty 4, 28d supply, fill #1
  Filled 2022-01-09: qty 4, 28d supply, fill #2
  Filled 2022-02-13: qty 4, 28d supply, fill #3

## 2021-05-30 ENCOUNTER — Other Ambulatory Visit (HOSPITAL_COMMUNITY): Payer: Self-pay

## 2021-05-31 ENCOUNTER — Other Ambulatory Visit (HOSPITAL_COMMUNITY): Payer: Self-pay

## 2021-06-01 ENCOUNTER — Other Ambulatory Visit (HOSPITAL_COMMUNITY): Payer: Self-pay

## 2021-06-04 ENCOUNTER — Other Ambulatory Visit (HOSPITAL_COMMUNITY): Payer: Self-pay

## 2021-06-28 IMAGING — MG DIGITAL SCREENING BILATERAL MAMMOGRAM WITH TOMO AND CAD
8 series · 9 of 24 positions shown · non-contrast
Comparison: Previous exam(s).

CLINICAL DATA: Screening.

EXAM:
DIGITAL SCREENING BILATERAL MAMMOGRAM WITH TOMO AND CAD

[R MLO synth-2D]
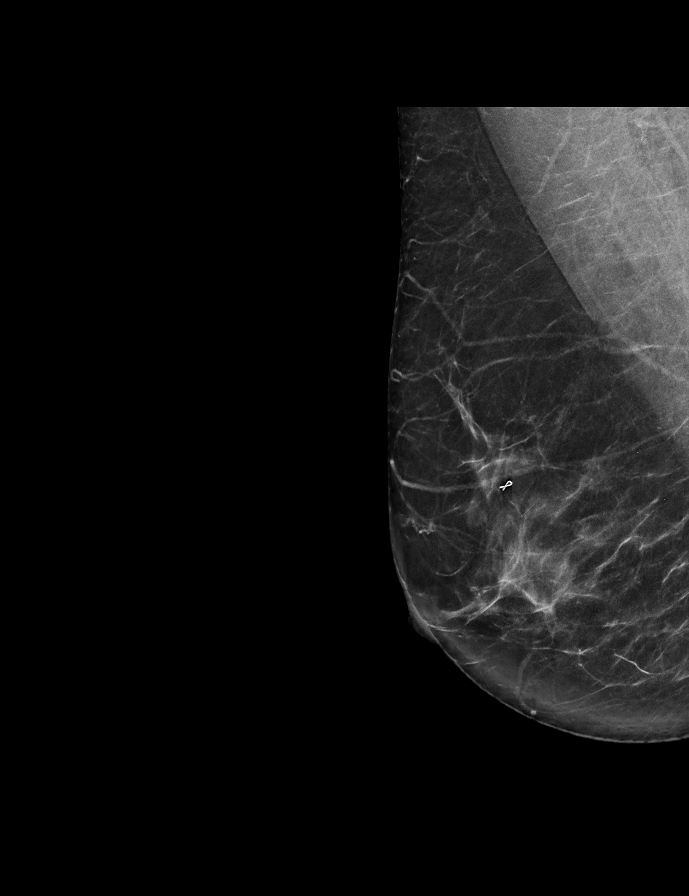

[L CC synth-2D]
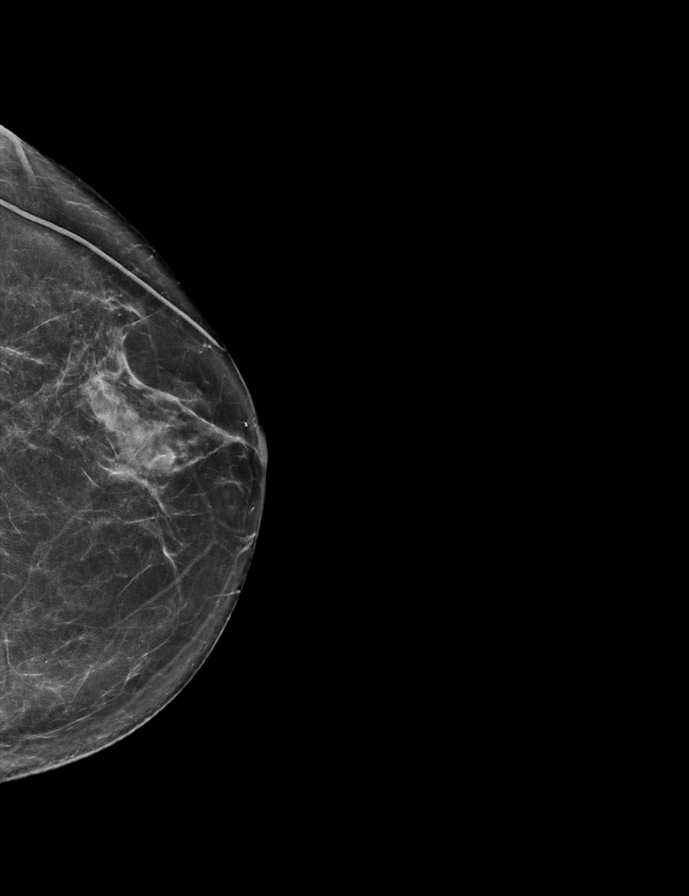

[R CC synth-2D]
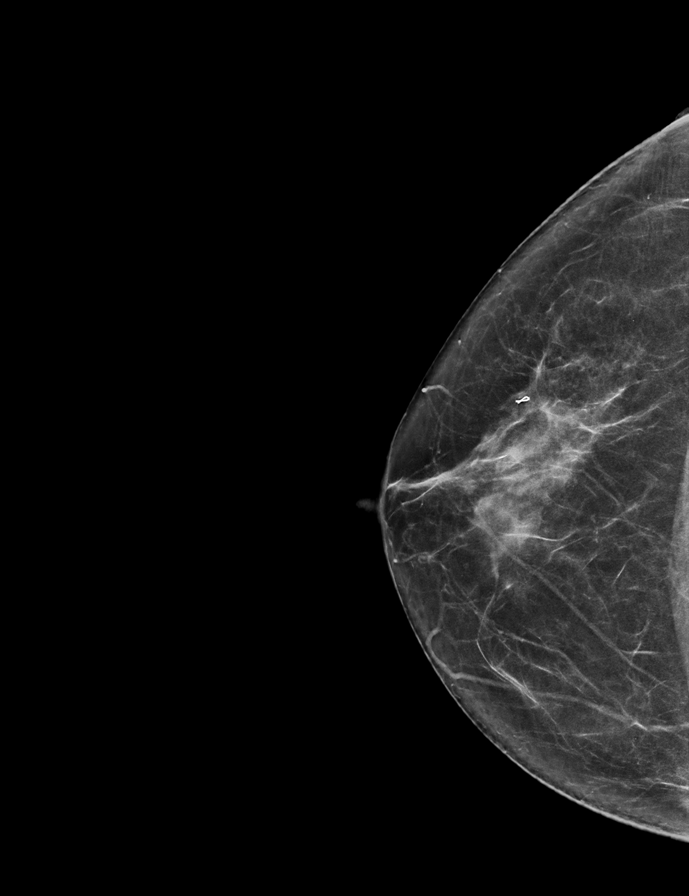

[L MLO synth-2D]
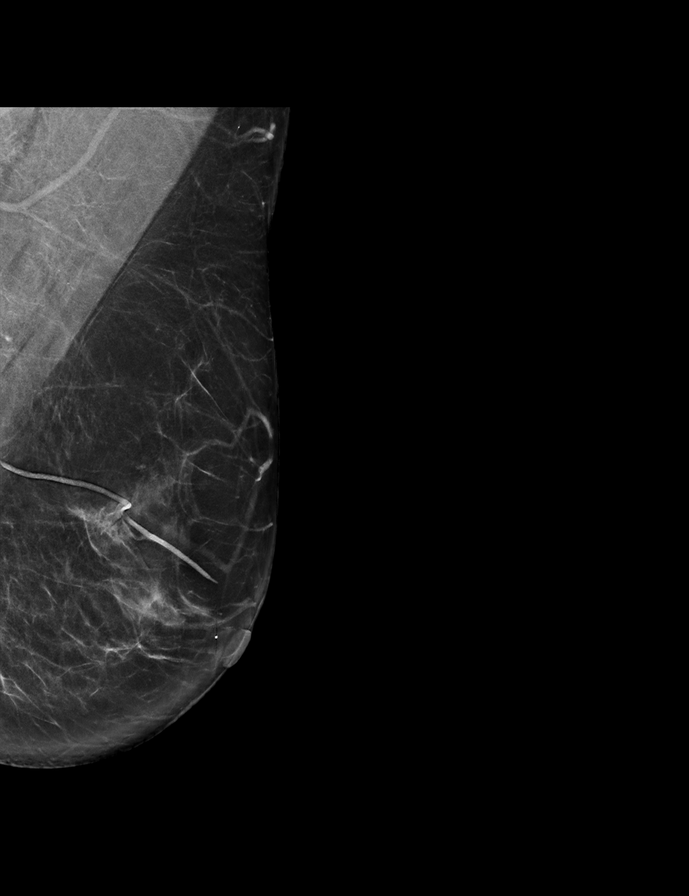

[L MLO tomo · 2 of 66 frames shown]
[frame 22/66]
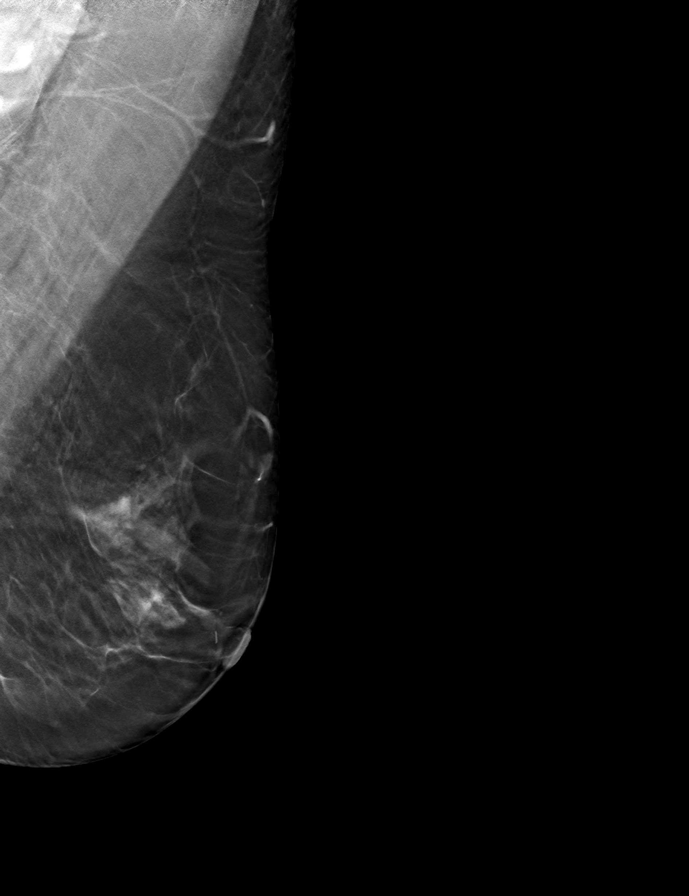
[frame 33/66]
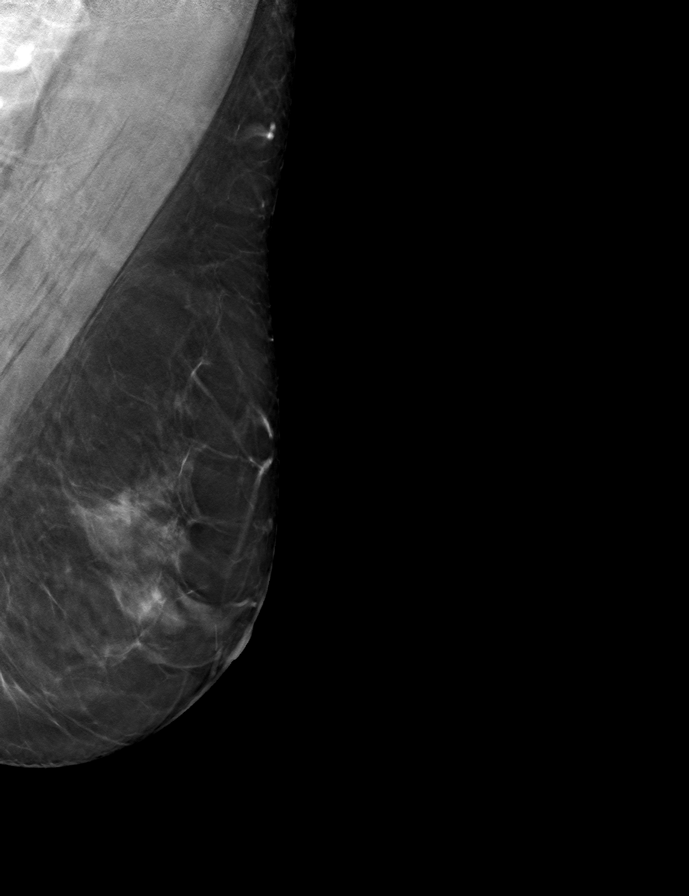

[R MLO tomo · tomo slice 33/66.0]
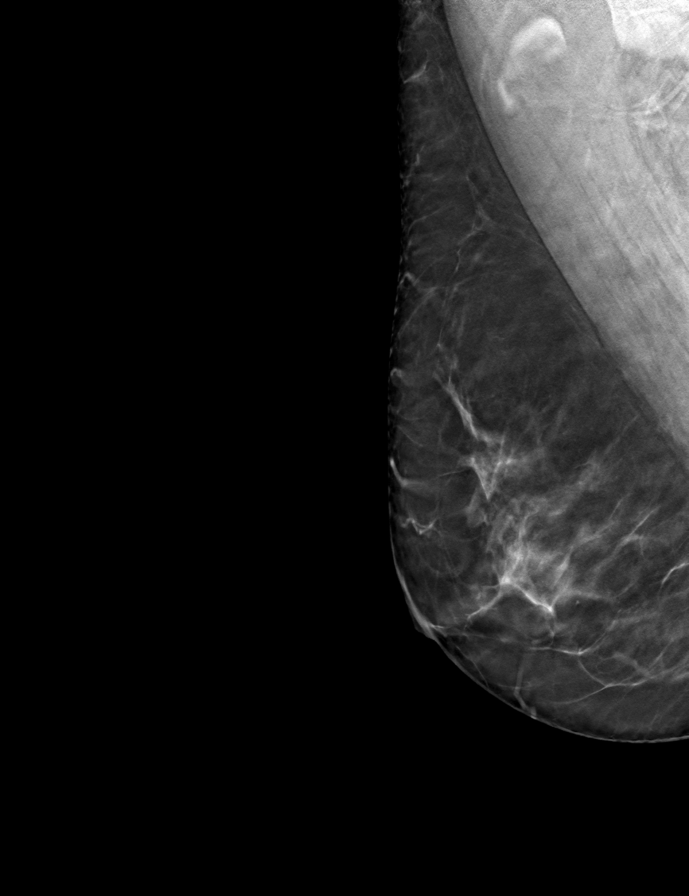

[L CC tomo · tomo slice 31/62.0]
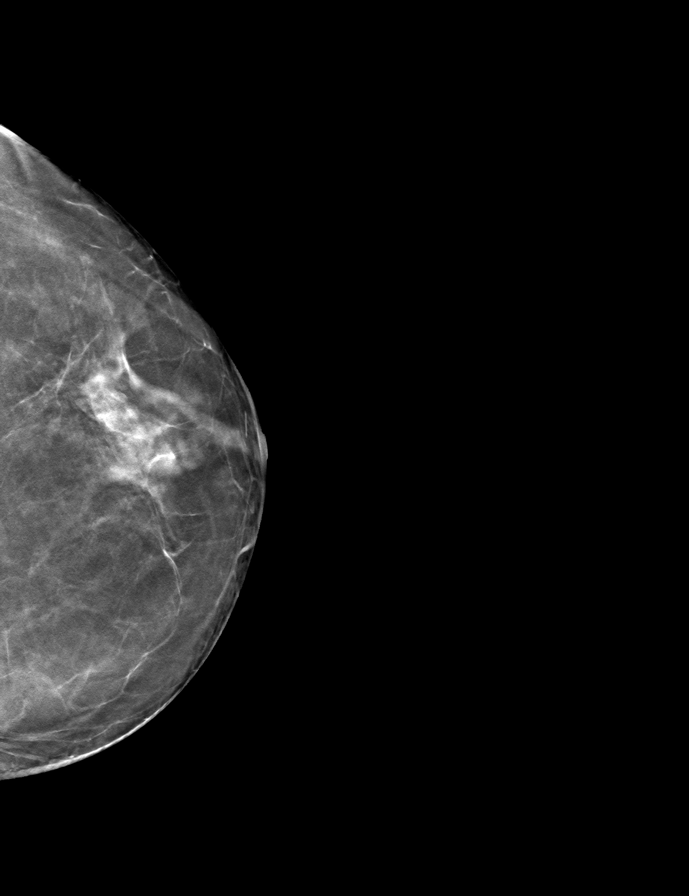

[R CC tomo · tomo slice 32/63.0]
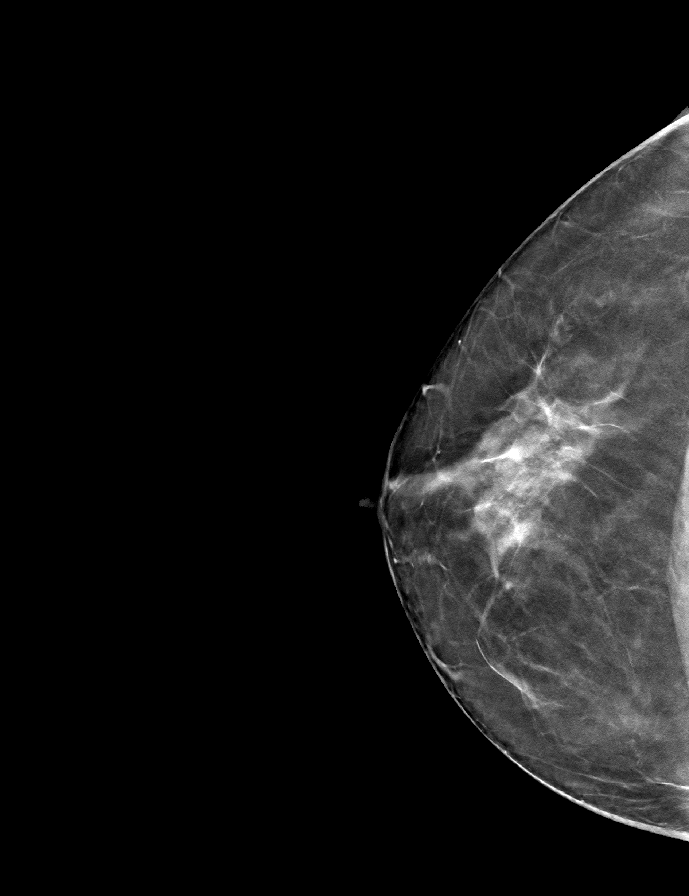

[9 of 24 positions shown; findings below may reference images not displayed]

ACR Breast Density Category b: There are scattered areas of
fibroglandular density.
FINDINGS: There are no findings suspicious for malignancy. Images were
processed with CAD.
IMPRESSION: No mammographic evidence of malignancy. A result letter of this
screening mammogram will be mailed directly to the patient.

RECOMMENDATION:
Screening mammogram in one year. (Code:[id number])

BI-RADS CATEGORY  1: Negative.

## 2021-07-04 ENCOUNTER — Encounter: Payer: 59 | Admitting: Family Medicine

## 2021-07-23 ENCOUNTER — Other Ambulatory Visit (HOSPITAL_COMMUNITY): Payer: Self-pay

## 2021-07-23 ENCOUNTER — Other Ambulatory Visit: Payer: Self-pay | Admitting: Family Medicine

## 2021-07-23 DIAGNOSIS — E039 Hypothyroidism, unspecified: Secondary | ICD-10-CM

## 2021-07-23 MED ORDER — LEVOTHYROXINE SODIUM 88 MCG PO TABS
88.0000 ug | ORAL_TABLET | Freq: Every day | ORAL | 0 refills | Status: DC
  Filled 2021-07-23: qty 30, 30d supply, fill #0

## 2021-07-23 NOTE — Telephone Encounter (Signed)
Called to find out if she needs this before her appt 07/26/21. kh

## 2021-07-23 NOTE — Telephone Encounter (Signed)
Pt called back and states that she does need a refill before her appt

## 2021-07-26 ENCOUNTER — Encounter: Payer: Self-pay | Admitting: Family Medicine

## 2021-07-26 ENCOUNTER — Other Ambulatory Visit: Payer: Self-pay

## 2021-07-26 ENCOUNTER — Ambulatory Visit: Payer: 59 | Admitting: Family Medicine

## 2021-07-26 VITALS — BP 98/62 | HR 78 | Temp 98.2°F | Ht 64.5 in | Wt 152.4 lb

## 2021-07-26 DIAGNOSIS — J301 Allergic rhinitis due to pollen: Secondary | ICD-10-CM | POA: Insufficient documentation

## 2021-07-26 DIAGNOSIS — Z853 Personal history of malignant neoplasm of breast: Secondary | ICD-10-CM

## 2021-07-26 DIAGNOSIS — N951 Menopausal and female climacteric states: Secondary | ICD-10-CM

## 2021-07-26 DIAGNOSIS — Z8249 Family history of ischemic heart disease and other diseases of the circulatory system: Secondary | ICD-10-CM | POA: Diagnosis not present

## 2021-07-26 DIAGNOSIS — R140 Abdominal distension (gaseous): Secondary | ICD-10-CM

## 2021-07-26 DIAGNOSIS — E782 Mixed hyperlipidemia: Secondary | ICD-10-CM | POA: Diagnosis not present

## 2021-07-26 DIAGNOSIS — Z0000 Encounter for general adult medical examination without abnormal findings: Secondary | ICD-10-CM | POA: Diagnosis not present

## 2021-07-26 DIAGNOSIS — E038 Other specified hypothyroidism: Secondary | ICD-10-CM

## 2021-07-26 DIAGNOSIS — K581 Irritable bowel syndrome with constipation: Secondary | ICD-10-CM

## 2021-07-26 DIAGNOSIS — E7439 Other disorders of intestinal carbohydrate absorption: Secondary | ICD-10-CM

## 2021-07-26 DIAGNOSIS — R196 Halitosis: Secondary | ICD-10-CM

## 2021-07-26 DIAGNOSIS — K640 First degree hemorrhoids: Secondary | ICD-10-CM

## 2021-07-26 MED ORDER — HYDROCORT-PRAMOXINE (PERIANAL) 1-1 % EX FOAM: 1.0000 | Freq: Two times a day (BID) | CUTANEOUS | 5 refills | Status: DC

## 2021-07-26 NOTE — Progress Notes (Signed)
   Subjective:    Patient ID: Amanda Jensen, female    DOB: 06/20/1966, 55 y.o.   MRN: [id number]  HPI She is here for complete examination.  She has switched jobs and is no longer on the Lexapro.  She seems to be doing quite nicely of the medicine.  She had a recent mammogram which showed a possible lesion however follow-up showed nothing to worry about.  She has had a hysterectomy but has had some difficulty with lack of libido and is now on some hormone replacement for that.  She is also been seen by Dr. Collene Mares and has had difficulty with halitosis, bloating and IBS type symptoms.  Dr. Collene Mares would like to have her checked for celiac disease.  She continues on her atorvastatin and having no difficulty with that.  She is now taking 20 mg instead of 40 mg.  She is also taking Synthroid.  Her allergies are not causing any difficulty.  She does use Zyrtec on an as-needed basis.  She does have a history of glucose intolerance.  Her work and home life are going quite well.  Family and social history as well as health maintenance and immunizations was reviewed   Review of Systems  All other systems reviewed and are negative.     Objective:   Physical Exam Alert and in no distress. Tympanic membranes and canals are normal. Pharyngeal area is normal. Neck is supple without adenopathy or thyromegaly. Cardiac exam shows a regular sinus rhythm without murmurs or gallops. Lungs are clear to auscultation.  Abdominal exam shows no masses or tenderness.       Assessment & Plan:  Routine general medical examination at a health care facility - Plan: CBC with Differential/Platelet, Comprehensive metabolic panel, Lipid panel  Hot flashes due to menopause  Other specified hypothyroidism - Plan: TSH  Family history of heart disease in female family member before age 19 - Plan: Lipid panel  Mixed hyperlipidemia - Plan: Lipid panel  Grade I hemorrhoids - Plan: hydrocortisone-pramoxine (PROCTOFOAM-HC)  rectal foam  History of breast cancer in female  Halitosis  Bloating - Plan: Celiac panel 10  Irritable bowel syndrome with constipation - Plan: Celiac panel 10  Seasonal allergic rhinitis due to pollen  Glucose intolerance - Plan: Hemoglobin A1c I will wait on the blood work before deciding upon the proper dosing of the Lipitor.  Although she is not fasting it should only really affect her triglycerides. Proctofoam called in for trouble with hemorrhoids if she has it.  Continue on thyroid medicine. Continue to treat allergies on an as-needed basis.

## 2021-07-27 LAB — HEMOGLOBIN A1C
Est. average glucose Bld gHb Est-mCnc: 117 mg/dL
Hgb A1c MFr Bld: 5.7 % — ABNORMAL HIGH (ref 4.8–5.6)

## 2021-07-28 LAB — LIPID PANEL
Chol/HDL Ratio: 3 ratio (ref 0.0–4.4)
Cholesterol, Total: 213 mg/dL — ABNORMAL HIGH (ref 100–199)
HDL: 71 mg/dL (ref >39)
LDL Chol Calc (NIH): 118 mg/dL — ABNORMAL HIGH (ref 0–99)
Triglycerides: 140 mg/dL (ref 0–149)
VLDL Cholesterol Cal: 24 mg/dL (ref 5–40)

## 2021-07-28 LAB — CBC WITH DIFFERENTIAL/PLATELET
Basophils Absolute: 0 10*3/uL (ref 0.0–0.2)
Basos: 1 %
EOS (ABSOLUTE): 0.1 10*3/uL (ref 0.0–0.4)
Eos: 1 %
Hematocrit: 38.5 % (ref 34.0–46.6)
Hemoglobin: 13 g/dL (ref 11.1–15.9)
Immature Grans (Abs): 0 10*3/uL (ref 0.0–0.1)
Immature Granulocytes: 0 %
Lymphocytes Absolute: 2.2 10*3/uL (ref 0.7–3.1)
Lymphs: 31 %
MCH: 31 pg (ref 26.6–33.0)
MCHC: 33.8 g/dL (ref 31.5–35.7)
MCV: 92 fL (ref 79–97)
Monocytes Absolute: 0.5 10*3/uL (ref 0.1–0.9)
Monocytes: 7 %
Neutrophils Absolute: 4.3 10*3/uL (ref 1.4–7.0)
Neutrophils: 60 %
Platelets: 241 10*3/uL (ref 150–450)
RBC: 4.2 x10E6/uL (ref 3.77–5.28)
RDW: 12.8 % (ref 11.7–15.4)
WBC: 7 10*3/uL (ref 3.4–10.8)

## 2021-07-28 LAB — COMPREHENSIVE METABOLIC PANEL
ALT: 16 IU/L (ref 0–32)
AST: 12 IU/L (ref 0–40)
Albumin/Globulin Ratio: 1.8 (ref 1.2–2.2)
Albumin: 4.2 g/dL (ref 3.8–4.9)
Alkaline Phosphatase: 45 IU/L (ref 44–121)
BUN/Creatinine Ratio: 23 (ref 9–23)
BUN: 21 mg/dL (ref 6–24)
Bilirubin Total: 0.3 mg/dL (ref 0.0–1.2)
CO2: 24 mmol/L (ref 20–29)
Calcium: 9.6 mg/dL (ref 8.7–10.2)
Chloride: 109 mmol/L — ABNORMAL HIGH (ref 96–106)
Creatinine, Ser: 0.93 mg/dL (ref 0.57–1.00)
Globulin, Total: 2.4 g/dL (ref 1.5–4.5)
Glucose: 101 mg/dL — ABNORMAL HIGH (ref 70–99)
Potassium: 4.3 mmol/L (ref 3.5–5.2)
Sodium: 147 mmol/L — ABNORMAL HIGH (ref 134–144)
Total Protein: 6.6 g/dL (ref 6.0–8.5)
eGFR: 72 mL/min/{1.73_m2} (ref >59)

## 2021-07-28 LAB — TSH: TSH: 2.52 u[IU]/mL (ref 0.450–4.500)

## 2021-07-28 LAB — CELIAC PANEL 10
Antigliadin Abs, IgA: 3 units (ref 0–19)
Endomysial IgA: NEGATIVE
Gliadin IgG: 1 units (ref 0–19)
IgA/Immunoglobulin A, Serum: 115 mg/dL (ref 87–352)
Tissue Transglut Ab: <2 U/mL (ref 0–5)
Transglutaminase IgA: <2 U/mL (ref 0–3)

## 2021-08-06 ENCOUNTER — Other Ambulatory Visit (HOSPITAL_COMMUNITY): Payer: Self-pay

## 2021-08-08 ENCOUNTER — Other Ambulatory Visit (HOSPITAL_COMMUNITY): Payer: Self-pay

## 2021-08-15 ENCOUNTER — Other Ambulatory Visit (HOSPITAL_COMMUNITY): Payer: Self-pay

## 2021-09-06 ENCOUNTER — Encounter: Payer: Self-pay | Admitting: Family Medicine

## 2021-09-11 ENCOUNTER — Other Ambulatory Visit (HOSPITAL_COMMUNITY): Payer: Self-pay

## 2021-09-11 ENCOUNTER — Other Ambulatory Visit: Payer: Self-pay | Admitting: Family Medicine

## 2021-09-11 DIAGNOSIS — E039 Hypothyroidism, unspecified: Secondary | ICD-10-CM

## 2021-09-11 DIAGNOSIS — K640 First degree hemorrhoids: Secondary | ICD-10-CM

## 2021-09-11 DIAGNOSIS — N951 Menopausal and female climacteric states: Secondary | ICD-10-CM

## 2021-09-11 MED ORDER — ATORVASTATIN CALCIUM 40 MG PO TABS
40.0000 mg | ORAL_TABLET | Freq: Every day | ORAL | 3 refills | Status: DC
  Filled 2021-09-11: qty 30, 30d supply, fill #0
  Filled 2021-12-03: qty 30, 30d supply, fill #1
  Filled 2022-01-09: qty 30, 30d supply, fill #2

## 2021-09-11 MED ORDER — LEVOTHYROXINE SODIUM 88 MCG PO TABS
88.0000 ug | ORAL_TABLET | Freq: Every day | ORAL | 0 refills | Status: DC
  Filled 2021-09-11: qty 30, 30d supply, fill #0

## 2021-09-11 MED ORDER — HYDROCORT-PRAMOXINE (PERIANAL) 1-1 % EX FOAM
1.0000 | Freq: Two times a day (BID) | CUTANEOUS | 5 refills | Status: DC
  Filled 2021-09-11: qty 10, 19d supply, fill #0

## 2021-10-30 ENCOUNTER — Other Ambulatory Visit: Payer: Self-pay | Admitting: Family Medicine

## 2021-10-30 ENCOUNTER — Other Ambulatory Visit (HOSPITAL_COMMUNITY): Payer: Self-pay

## 2021-10-30 DIAGNOSIS — E039 Hypothyroidism, unspecified: Secondary | ICD-10-CM

## 2021-10-30 DIAGNOSIS — N951 Menopausal and female climacteric states: Secondary | ICD-10-CM

## 2021-10-30 MED ORDER — ESCITALOPRAM OXALATE 10 MG PO TABS
10.0000 mg | ORAL_TABLET | Freq: Every day | ORAL | 3 refills | Status: DC
  Filled 2021-10-30: qty 30, 30d supply, fill #0
  Filled 2021-12-03: qty 30, 30d supply, fill #1
  Filled 2022-01-09: qty 30, 30d supply, fill #2

## 2021-10-30 MED ORDER — LEVOTHYROXINE SODIUM 88 MCG PO TABS
88.0000 ug | ORAL_TABLET | Freq: Every day | ORAL | 3 refills | Status: DC
  Filled 2021-10-30: qty 30, 30d supply, fill #0
  Filled 2021-12-03: qty 30, 30d supply, fill #1
  Filled 2022-01-09: qty 30, 30d supply, fill #2
  Filled 2022-02-13: qty 30, 30d supply, fill #3

## 2021-10-30 NOTE — Progress Notes (Signed)
She would like a refill on her Lexapro.  She is having increased difficulty with hot flashes.  Also needs her thyroid renewed ?

## 2021-11-23 ENCOUNTER — Other Ambulatory Visit: Payer: Self-pay | Admitting: Family Medicine

## 2021-11-23 DIAGNOSIS — N6489 Other specified disorders of breast: Secondary | ICD-10-CM

## 2021-12-03 ENCOUNTER — Ambulatory Visit
Admission: RE | Admit: 2021-12-03 | Discharge: 2021-12-03 | Disposition: A | Discharge status: Home / Self Care | Payer: 59 | Source: Ambulatory Visit | Attending: Family Medicine | Admitting: Family Medicine

## 2021-12-03 ENCOUNTER — Other Ambulatory Visit: Payer: Self-pay | Admitting: Family Medicine

## 2021-12-03 ENCOUNTER — Ambulatory Visit: Admission: RE | Admit: 2021-12-03 | Payer: 59 | Source: Ambulatory Visit

## 2021-12-03 ENCOUNTER — Other Ambulatory Visit (HOSPITAL_COMMUNITY): Payer: Self-pay

## 2021-12-03 DIAGNOSIS — N6489 Other specified disorders of breast: Secondary | ICD-10-CM

## 2022-01-09 ENCOUNTER — Other Ambulatory Visit (HOSPITAL_COMMUNITY): Payer: Self-pay

## 2022-01-28 ENCOUNTER — Other Ambulatory Visit (HOSPITAL_COMMUNITY): Payer: Self-pay

## 2022-01-28 MED ORDER — DOXYCYCLINE HYCLATE 100 MG PO CAPS
100.0000 mg | ORAL_CAPSULE | Freq: Two times a day (BID) | ORAL | 0 refills | Status: AC
  Filled 2022-01-28: qty 14, 7d supply, fill #0

## 2022-01-28 MED ORDER — HYDROCODONE-ACETAMINOPHEN 5-325 MG PO TABS
1.0000 | ORAL_TABLET | ORAL | 0 refills | Status: AC | PRN
  Filled 2022-01-28: qty 40, 7d supply, fill #0

## 2022-02-13 ENCOUNTER — Other Ambulatory Visit (HOSPITAL_COMMUNITY): Payer: Self-pay

## 2022-03-06 ENCOUNTER — Other Ambulatory Visit: Payer: Self-pay

## 2022-03-06 DIAGNOSIS — E039 Hypothyroidism, unspecified: Secondary | ICD-10-CM

## 2022-03-06 DIAGNOSIS — E782 Mixed hyperlipidemia: Secondary | ICD-10-CM

## 2022-03-06 MED ORDER — LEVOTHYROXINE SODIUM 88 MCG PO TABS: 88.0000 ug | ORAL_TABLET | Freq: Every day | ORAL | 3 refills | Status: DC

## 2022-03-06 MED ORDER — ATORVASTATIN CALCIUM 40 MG PO TABS
40.0000 mg | ORAL_TABLET | Freq: Every day | ORAL | 3 refills | Status: DC
Start: 2022-03-06 — End: 2022-12-24

## 2022-04-24 ENCOUNTER — Encounter: Payer: Self-pay | Admitting: Internal Medicine

## 2022-05-06 ENCOUNTER — Ambulatory Visit
Admission: RE | Admit: 2022-05-06 | Discharge: 2022-05-06 | Disposition: A | Discharge status: Home / Self Care | Payer: 59 | Source: Ambulatory Visit | Attending: Family Medicine | Admitting: Family Medicine

## 2022-05-06 ENCOUNTER — Ambulatory Visit: Payer: 59

## 2022-05-06 DIAGNOSIS — N6489 Other specified disorders of breast: Secondary | ICD-10-CM

## 2022-05-07 ENCOUNTER — Encounter: Payer: Self-pay | Admitting: Family Medicine

## 2022-05-09 ENCOUNTER — Telehealth: Payer: Self-pay | Admitting: Internal Medicine

## 2022-05-09 NOTE — Telephone Encounter (Signed)
Patient has upcoming trip to [place]: going to [place], [place], [place], and [place] region. Per CDC travel guidance and malaria map, the travel itinerary does not require any further vaccines than they have already. In addition, does not need malaria proph but do recommend insect bite repellant. I have conveyed info to patient.  Amanda Jensen [place] [facility] [phone]

## 2022-06-10 ENCOUNTER — Encounter: Payer: Self-pay | Admitting: Internal Medicine

## 2022-12-23 DIAGNOSIS — E782 Mixed hyperlipidemia: Secondary | ICD-10-CM | POA: Insufficient documentation

## 2022-12-24 ENCOUNTER — Ambulatory Visit (INDEPENDENT_AMBULATORY_CARE_PROVIDER_SITE_OTHER): Payer: 59 | Admitting: Family Medicine

## 2022-12-24 ENCOUNTER — Encounter: Payer: Self-pay | Admitting: Family Medicine

## 2022-12-24 VITALS — BP 120/72 | HR 58 | Temp 97.7°F | Resp 14 | Ht 64.75 in | Wt 150.8 lb

## 2022-12-24 DIAGNOSIS — R7303 Prediabetes: Secondary | ICD-10-CM

## 2022-12-24 DIAGNOSIS — Z0000 Encounter for general adult medical examination without abnormal findings: Secondary | ICD-10-CM | POA: Diagnosis not present

## 2022-12-24 DIAGNOSIS — Z853 Personal history of malignant neoplasm of breast: Secondary | ICD-10-CM

## 2022-12-24 DIAGNOSIS — M25562 Pain in left knee: Secondary | ICD-10-CM

## 2022-12-24 DIAGNOSIS — M1990 Unspecified osteoarthritis, unspecified site: Secondary | ICD-10-CM

## 2022-12-24 DIAGNOSIS — N951 Menopausal and female climacteric states: Secondary | ICD-10-CM

## 2022-12-24 DIAGNOSIS — J301 Allergic rhinitis due to pollen: Secondary | ICD-10-CM

## 2022-12-24 DIAGNOSIS — K640 First degree hemorrhoids: Secondary | ICD-10-CM

## 2022-12-24 DIAGNOSIS — Z23 Encounter for immunization: Secondary | ICD-10-CM | POA: Diagnosis not present

## 2022-12-24 DIAGNOSIS — J45990 Exercise induced bronchospasm: Secondary | ICD-10-CM | POA: Diagnosis not present

## 2022-12-24 DIAGNOSIS — K581 Irritable bowel syndrome with constipation: Secondary | ICD-10-CM

## 2022-12-24 DIAGNOSIS — Z8249 Family history of ischemic heart disease and other diseases of the circulatory system: Secondary | ICD-10-CM

## 2022-12-24 DIAGNOSIS — E039 Hypothyroidism, unspecified: Secondary | ICD-10-CM

## 2022-12-24 DIAGNOSIS — E038 Other specified hypothyroidism: Secondary | ICD-10-CM

## 2022-12-24 DIAGNOSIS — Z90710 Acquired absence of both cervix and uterus: Secondary | ICD-10-CM | POA: Insufficient documentation

## 2022-12-24 DIAGNOSIS — E782 Mixed hyperlipidemia: Secondary | ICD-10-CM

## 2022-12-24 LAB — LIPID PANEL

## 2022-12-24 MED ORDER — ATORVASTATIN CALCIUM 40 MG PO TABS: 40.0000 mg | ORAL_TABLET | Freq: Every day | ORAL | 3 refills | Status: DC

## 2022-12-24 MED ORDER — LEVOTHYROXINE SODIUM 88 MCG PO TABS: 88.0000 ug | ORAL_TABLET | Freq: Every day | ORAL | 3 refills | Status: DC

## 2022-12-24 NOTE — Progress Notes (Signed)
Complete physical exam  Patient: Amanda Jensen   DOB: [birth year]   57 y.o. Female  MRN: [id number]  Subjective:    Chief Complaint  Patient presents with   Annual Exam    Fasting.     Amanda Jensen is a 57 y.o. female who presents today for a complete physical exam. She reports consuming a general diet. Home exercise routine includes walking, jogging and weight lifting. She generally feels fairly well. She reports sleeping poorly. She recently had blood work done for a medical study and was found to have a hemoglobin A1c of 5.9.  She would like an insulin level drawn to evaluate this more thoroughly.  She also notes that her IBS and her hemorrhoidal symptoms that she has not had in the past have now been eliminated by her making dietary changes to get rid of artificial sweeteners.  She follows up regularly with GYN.  She has had a hysterectomy but cervix and 1 ovary was left in place.  She does not smoke and drinks sparingly.  She has not had any difficulties with hot flashes and has stopped taking her Lexapro approximately 1 year ago.  She is to be doing well with that.  Allergies cause very little difficulty she does use Zyrtec as needed.  She continues on Synthroid and is also taking Lipitor.  She has no troubles with either of these medications.  Otherwise her family and social history as well as health maintenance and immunizations were reviewed.   Most recent fall risk assessment:    12/24/2022    3:19 PM  Fall Risk   Falls in the past year? 0  Number falls in past yr: 0  Injury with Fall? 0  Risk for fall due to : No Fall Risks  Follow up Falls evaluation completed     Most recent depression screenings:    12/24/2022    3:19 PM 07/26/2021    1:42 PM  PHQ 2/9 Scores  PHQ - 2 Score 0 0    Vision:Within last year and Dental: Receives regular dental care    Patient Care Team: Ronnald Nian, MD as PCP - General (Family Medicine)   Outpatient Medications  Prior to Visit  Medication Sig   Calcium Carb-Cholecalciferol (CALCIUM 1000 + D PO) Take 1 tablet by mouth daily. Vitamin D 1,000IU   cetirizine (ZYRTEC) 10 MG tablet Take by mouth.   Cholecalciferol (VITAMIN D) 2000 UNITS tablet Take 4,000 Units by mouth daily.   Estradiol 10 MCG TABS vaginal tablet Place 1 tablet (10 mcg total) vaginally once a week.   ibuprofen (ADVIL,MOTRIN) 600 MG tablet Take 600 mg by mouth every 8 (eight) hours as needed. For pain    Omega-3 Fatty Acids (FISH OIL) 1000 MG CAPS Take 2,000 mg by mouth 2 (two) times daily.    [DISCONTINUED] atorvastatin (LIPITOR) 40 MG tablet Take 1 tablet (40 mg total) by mouth daily.   [DISCONTINUED] levothyroxine (SYNTHROID) 88 MCG tablet Take 1 tablet (88 mcg total) by mouth daily before breakfast.   escitalopram (LEXAPRO) 10 MG tablet Take 1 tablet (10 mg total) by mouth daily for hot flashes.   [DISCONTINUED] albuterol (VENTOLIN HFA) 108 (90 Base) MCG/ACT inhaler Inhale 2 puffs into the lungs every 6 (six) hours as needed for wheezing or shortness of breath.   [DISCONTINUED] hydrocortisone-pramoxine (PROCTOFOAM-HC) rectal foam Place 1 applicator rectally 2 (two) times daily.   [DISCONTINUED] influenza vac split quadrivalent PF (FLUARIX) 0.5 ML injection Inject 0.5  mLs into the muscle.   [DISCONTINUED] Zoster Vaccine Adjuvanted [facility]) injection Inject 0.5 mLs into the muscle.   No facility-administered medications prior to visit.    Review of Systems  All other systems reviewed and are negative.         Objective:     BP 120/72   Pulse (!) 58   Temp 97.7 F (36.5 C) (Oral)   Resp 14   Ht 5' 4.75" (1.645 m)   Wt 150 lb 12.8 oz (68.4 kg)   SpO2 96% Comment: room air  BMI 25.29 kg/m    Physical Exam  Alert and in no distress. Tympanic membranes and canals are normal. Pharyngeal area is normal. Neck is supple without adenopathy or thyromegaly. Cardiac exam shows a regular sinus rhythm without murmurs or gallops.  Lungs are clear to auscultation.      Assessment & Plan:    Routine general medical examination at a health care facility - Plan: CBC with Differential/Platelet, Comprehensive metabolic panel, Lipid panel  Hot flashes due to menopause  Seasonal allergic rhinitis due to pollen  Exercise-induced asthma - Plan: CBC with Differential/Platelet, Comprehensive metabolic panel  Other specified hypothyroidism - Plan: TSH  History of breast cancer in female  Family history of heart disease in female family member before age 37 - Plan: Lipid panel  Need for Tdap vaccination - Plan: Tdap vaccine greater than or equal to 7yo IM  Mixed hyperlipidemia - Plan: Lipid panel, atorvastatin (LIPITOR) 40 MG tablet  Pre-diabetes - Plan: Insulin and C-Peptide, Hemoglobin A1c, CANCELED: POCT glycosylated hemoglobin (Hb A1C)  Hypothyroidism - Plan: levothyroxine (SYNTHROID) 88 MCG tablet  History of hysterectomy - Cervix and 1 ovary left in place  Immunization History  Administered Date(s) Administered   Influenza Split 04/20/2011, 04/19/2013, 05/07/2022   Influenza,inj,Quad PF,6+ Mos 05/29/2021   Influenza-Unspecified 05/19/2016, 04/21/2018   Pfizer Covid-19 Vaccine Bivalent Booster 33yrs & up 05/07/2022   Tdap 02/17/2012   Typhoid Inactivated 05/29/2012, 12/13/2016, 11/09/2020   Yellow Fever 05/29/2012   Zoster Recombinat (Shingrix) 06/04/2021, 08/15/2021    Health Maintenance  Topic Date Due   HIV Screening  Never done   DTaP/Tdap/Td (2 - Td or Tdap) 02/16/2022   COVID-19 Vaccine (2 - 2023-24 season) 07/02/2022   INFLUENZA VACCINE  03/20/2023   MAMMOGRAM  05/06/2024   COLONOSCOPY (Pts 45-29yrs Insurance coverage will need to be confirmed)  05/18/2025   Hepatitis C Screening  Completed   Zoster Vaccines- Shingrix  Completed   HPV VACCINES  Aged Out   PAP SMEAR-Modifier  Discontinued    Discussed health benefits of physical activity, and encouraged her to engage in regular exercise  appropriate for her age and condition.  Problem List Items Addressed This Visit     Exercise-induced asthma   Relevant Orders   CBC with Differential/Platelet   Comprehensive metabolic panel   Family history of heart disease in female family member before age 81   Relevant Orders   Lipid panel   History of breast cancer in female   History of hysterectomy   Hot flashes due to menopause   Relevant Medications   atorvastatin (LIPITOR) 40 MG tablet   Hypothyroidism   Relevant Medications   levothyroxine (SYNTHROID) 88 MCG tablet   Other Relevant Orders   TSH   Mixed hyperlipidemia   Relevant Medications   atorvastatin (LIPITOR) 40 MG tablet   Other Relevant Orders   Lipid panel   Seasonal allergic rhinitis due to pollen   Other Visit  Diagnoses     Routine general medical examination at a health care facility    -  Primary   Relevant Orders   CBC with Differential/Platelet   Comprehensive metabolic panel   Lipid panel   Need for Tdap vaccination       Relevant Orders   Tdap vaccine greater than or equal to 7yo IM   Pre-diabetes       Relevant Orders   Insulin and C-Peptide   Hemoglobin A1c      Continue on present medications.  Follow-up here in 1 year.    Sharlot Gowda, MD

## 2022-12-25 LAB — LIPID PANEL
Chol/HDL Ratio: 1.9 ratio (ref 0.0–4.4)
Cholesterol, Total: 154 mg/dL (ref 100–199)
HDL: 83 mg/dL (ref >39)
LDL Chol Calc (NIH): 58 mg/dL (ref 0–99)
Triglycerides: 66 mg/dL (ref 0–149)
VLDL Cholesterol Cal: 13 mg/dL (ref 5–40)

## 2022-12-25 LAB — CBC WITH DIFFERENTIAL/PLATELET
Basophils Absolute: 0 10*3/uL (ref 0.0–0.2)
Basos: 0 %
EOS (ABSOLUTE): 0.1 10*3/uL (ref 0.0–0.4)
Eos: 1 %
Hematocrit: 38.9 % (ref 34.0–46.6)
Hemoglobin: 13 g/dL (ref 11.1–15.9)
Immature Grans (Abs): 0 10*3/uL (ref 0.0–0.1)
Immature Granulocytes: 0 %
Lymphocytes Absolute: 2.8 10*3/uL (ref 0.7–3.1)
Lymphs: 36 %
MCH: 31 pg (ref 26.6–33.0)
MCHC: 33.4 g/dL (ref 31.5–35.7)
MCV: 93 fL (ref 79–97)
Monocytes Absolute: 0.5 10*3/uL (ref 0.1–0.9)
Monocytes: 6 %
Neutrophils Absolute: 4.4 10*3/uL (ref 1.4–7.0)
Neutrophils: 57 %
Platelets: 261 10*3/uL (ref 150–450)
RBC: 4.2 x10E6/uL (ref 3.77–5.28)
RDW: 12.7 % (ref 11.7–15.4)
WBC: 7.8 10*3/uL (ref 3.4–10.8)

## 2022-12-25 LAB — COMPREHENSIVE METABOLIC PANEL
ALT: 30 IU/L (ref 0–32)
AST: 24 IU/L (ref 0–40)
Albumin/Globulin Ratio: 2.1 (ref 1.2–2.2)
Albumin: 4.6 g/dL (ref 3.8–4.9)
Alkaline Phosphatase: 37 IU/L — ABNORMAL LOW (ref 44–121)
BUN/Creatinine Ratio: 13 (ref 9–23)
BUN: 12 mg/dL (ref 6–24)
Bilirubin Total: 0.5 mg/dL (ref 0.0–1.2)
CO2: 24 mmol/L (ref 20–29)
Calcium: 10 mg/dL (ref 8.7–10.2)
Chloride: 105 mmol/L (ref 96–106)
Creatinine, Ser: 0.9 mg/dL (ref 0.57–1.00)
Globulin, Total: 2.2 g/dL (ref 1.5–4.5)
Glucose: 98 mg/dL (ref 70–99)
Potassium: 4.1 mmol/L (ref 3.5–5.2)
Sodium: 141 mmol/L (ref 134–144)
Total Protein: 6.8 g/dL (ref 6.0–8.5)
eGFR: 75 mL/min/{1.73_m2} (ref >59)

## 2022-12-25 LAB — TSH: TSH: 1.67 u[IU]/mL (ref 0.450–4.500)

## 2022-12-25 LAB — HEMOGLOBIN A1C
Est. average glucose Bld gHb Est-mCnc: 123 mg/dL
Hgb A1c MFr Bld: 5.9 % — ABNORMAL HIGH (ref 4.8–5.6)

## 2022-12-25 LAB — INSULIN AND C-PEPTIDE, SERUM
C-Peptide: 2.1 ng/mL (ref 1.1–4.4)
INSULIN: 6.2 u[IU]/mL (ref 2.6–24.9)

## 2023-04-23 ENCOUNTER — Other Ambulatory Visit: Payer: Self-pay | Admitting: Family Medicine

## 2023-04-23 DIAGNOSIS — N6489 Other specified disorders of breast: Secondary | ICD-10-CM

## 2023-05-30 ENCOUNTER — Ambulatory Visit
Admission: RE | Admit: 2023-05-30 | Discharge: 2023-05-30 | Disposition: A | Discharge status: Home / Self Care | Payer: BC Managed Care – PPO | Source: Ambulatory Visit | Attending: Family Medicine | Admitting: Family Medicine

## 2023-05-30 DIAGNOSIS — N6489 Other specified disorders of breast: Secondary | ICD-10-CM

## 2023-07-24 ENCOUNTER — Other Ambulatory Visit: Payer: Self-pay | Admitting: Family Medicine

## 2023-07-24 DIAGNOSIS — E039 Hypothyroidism, unspecified: Secondary | ICD-10-CM

## 2023-07-24 DIAGNOSIS — E782 Mixed hyperlipidemia: Secondary | ICD-10-CM

## 2023-07-24 MED ORDER — LEVOTHYROXINE SODIUM 88 MCG PO TABS: 88.0000 ug | ORAL_TABLET | Freq: Every day | ORAL | 3 refills | Status: DC

## 2023-07-24 MED ORDER — ATORVASTATIN CALCIUM 40 MG PO TABS: 40.0000 mg | ORAL_TABLET | Freq: Every day | ORAL | 3 refills | Status: DC

## 2023-10-14 ENCOUNTER — Encounter: Payer: Self-pay | Admitting: Internal Medicine

## 2024-05-17 ENCOUNTER — Other Ambulatory Visit: Payer: Self-pay | Admitting: Family Medicine

## 2024-05-17 DIAGNOSIS — Z0000 Encounter for general adult medical examination without abnormal findings: Secondary | ICD-10-CM

## 2024-06-04 ENCOUNTER — Ambulatory Visit
Admission: RE | Admit: 2024-06-04 | Discharge: 2024-06-04 | Disposition: A | Discharge status: Home / Self Care | Source: Ambulatory Visit | Attending: Family Medicine | Admitting: Family Medicine

## 2024-06-04 DIAGNOSIS — Z0000 Encounter for general adult medical examination without abnormal findings: Secondary | ICD-10-CM

## 2024-06-29 ENCOUNTER — Ambulatory Visit: Admitting: Family Medicine

## 2024-06-29 ENCOUNTER — Encounter: Payer: Self-pay | Admitting: Family Medicine

## 2024-06-29 VITALS — BP 104/60 | HR 60 | Ht 64.0 in | Wt 149.0 lb

## 2024-06-29 DIAGNOSIS — J45990 Exercise induced bronchospasm: Secondary | ICD-10-CM

## 2024-06-29 DIAGNOSIS — E038 Other specified hypothyroidism: Secondary | ICD-10-CM

## 2024-06-29 DIAGNOSIS — Z8249 Family history of ischemic heart disease and other diseases of the circulatory system: Secondary | ICD-10-CM

## 2024-06-29 DIAGNOSIS — E782 Mixed hyperlipidemia: Secondary | ICD-10-CM

## 2024-06-29 DIAGNOSIS — Z853 Personal history of malignant neoplasm of breast: Secondary | ICD-10-CM

## 2024-06-29 DIAGNOSIS — N951 Menopausal and female climacteric states: Secondary | ICD-10-CM

## 2024-06-29 DIAGNOSIS — M1990 Unspecified osteoarthritis, unspecified site: Secondary | ICD-10-CM | POA: Diagnosis not present

## 2024-06-29 DIAGNOSIS — Z0000 Encounter for general adult medical examination without abnormal findings: Secondary | ICD-10-CM | POA: Diagnosis not present

## 2024-06-29 DIAGNOSIS — J301 Allergic rhinitis due to pollen: Secondary | ICD-10-CM

## 2024-06-29 DIAGNOSIS — Z23 Encounter for immunization: Secondary | ICD-10-CM | POA: Diagnosis not present

## 2024-06-29 DIAGNOSIS — M654 Radial styloid tenosynovitis [de Quervain]: Secondary | ICD-10-CM

## 2024-06-29 DIAGNOSIS — Z90710 Acquired absence of both cervix and uterus: Secondary | ICD-10-CM

## 2024-06-29 LAB — LIPID PANEL

## 2024-06-29 NOTE — Progress Notes (Signed)
   Subjective:    Patient ID: Amanda Jensen, female    DOB: 01-14-1966, 58 y.o.   MRN: [id number]  Discussed the use of AI scribe software for clinical note transcription with the patient, who gave verbal consent to proceed.  History of Present Illness   Amanda Jensen [place] is a 58 year old female who presents for routine lab work and a pneumonia vaccination.  She has been fasting for six hours in preparation for the blood work.  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 She has been on Synthroid  since then but recently missed two weeks of medication while on vacation and did not notice any symptoms. Her TSH levels have remained normal since residency.  She has a history of breast cancer diagnosed in 2014, for which she underwent treatment. She continues to have annual mammograms and occasionally requires follow-up ultrasounds if there are concerns. This is a routine part of her follow-up care.  She experiences significant allergy symptoms, including itchy eyes and nasal congestion, for which she is under the care of an allergist. Her current medications include fluticasone nasal spray, azelastine, Zyrtec, and Pataday as needed.  No current issues with hot flashes, which occur only rarely. She has a history of hysterectomy and hyperlipidemia.  Family history includes alcoholism, with her younger sister being affected. Her sister's son passed away at a young age due to reckless driving, impacting her sister's life significantly.  No issues with smoking or drinking. Home life and work are going well, though work can be stressful at times.     She has a history of de Quervain's and presently is wearing a brace.  It bothers her especially when she uses a mouse.      Review of Systems  All other systems reviewed and are negative.      Objective:    Physical Exam Alert and in no  distress. Tympanic membranes and canals are normal. Pharyngeal area is normal. Neck is supple without adenopathy or thyromegaly. Cardiac exam shows a regular sinus rhythm without murmurs or gallops. Lungs are clear to auscultation.              Assessment & Plan:  Assessment and Plan    Adult Wellness Visit Routine wellness visit with no acute concerns. Discussed health maintenance, labs, and immunizations. - Ordered CBC, CMP, and lipid panel. - Administered Prevnar vaccine.  Allergic rhinitis due to pollen Chronic allergic rhinitis managed with fluticasone, azelastine, Zyrtec, Pataday, and allergy shots. - Continue fluticasone, azelastine, Zyrtec, and Pataday as needed. - Continue allergy shots.  Hypothyroidism TSH levels normal on Synthroid . Discussed potential tapering to assess necessity. Consider thyroid  antibodies if symptoms develop. - Consider tapering Synthroid  for 1-2 months. - Order TSH, free T4, and thyroid  antibodies if symptoms develop.  Mixed hyperlipidemia Management discussed. - Ordered lipid panel.  Personal history of breast cancer Diagnosed in 2014. Undergoing routine annual mammograms with follow-up ultrasounds as needed. - Continue annual mammograms and follow-up ultrasounds as needed.

## 2024-06-30 ENCOUNTER — Ambulatory Visit: Payer: Self-pay | Admitting: Family Medicine

## 2024-06-30 LAB — LIPID PANEL
Cholesterol, Total: 162 mg/dL (ref 100–199)
HDL: 79 mg/dL (ref >39)
LDL CALC COMMENT:: 2.1 ratio (ref 0.0–4.4)
LDL Chol Calc (NIH): 69 mg/dL (ref 0–99)
Triglycerides: 73 mg/dL (ref 0–149)
VLDL Cholesterol Cal: 14 mg/dL (ref 5–40)

## 2024-06-30 LAB — CBC WITH DIFFERENTIAL/PLATELET
Basophils Absolute: 0 x10E3/uL (ref 0.0–0.2)
Basos: 0 %
EOS (ABSOLUTE): 0.1 x10E3/uL (ref 0.0–0.4)
Eos: 1 %
Hematocrit: 39.2 % (ref 34.0–46.6)
Hemoglobin: 13 g/dL (ref 11.1–15.9)
Immature Grans (Abs): 0 x10E3/uL (ref 0.0–0.1)
Immature Granulocytes: 0 %
Lymphocytes Absolute: 2.4 x10E3/uL (ref 0.7–3.1)
Lymphs: 36 %
MCH: 30.7 pg (ref 26.6–33.0)
MCHC: 33.2 g/dL (ref 31.5–35.7)
MCV: 93 fL (ref 79–97)
Monocytes Absolute: 0.4 x10E3/uL (ref 0.1–0.9)
Monocytes: 6 %
Neutrophils Absolute: 3.8 x10E3/uL (ref 1.4–7.0)
Neutrophils: 57 %
Platelets: 241 x10E3/uL (ref 150–450)
RBC: 4.23 x10E6/uL (ref 3.77–5.28)
RDW: 12.6 % (ref 11.7–15.4)
WBC: 6.7 x10E3/uL (ref 3.4–10.8)

## 2024-06-30 LAB — COMPREHENSIVE METABOLIC PANEL WITH GFR
ALT: 24 IU/L (ref 0–32)
AST: 27 IU/L (ref 0–40)
Albumin: 4.4 g/dL (ref 3.8–4.9)
Alkaline Phosphatase: 38 IU/L — AB (ref 49–135)
BUN/Creatinine Ratio: 22 (ref 9–23)
BUN: 17 mg/dL (ref 6–24)
Bilirubin Total: 0.4 mg/dL (ref 0.0–1.2)
CO2: 22 mmol/L (ref 20–29)
Calcium: 9.8 mg/dL (ref 8.7–10.2)
Chloride: 105 mmol/L (ref 96–106)
Creatinine, Ser: 0.79 mg/dL (ref 0.57–1.00)
Globulin, Total: 2.4 g/dL (ref 1.5–4.5)
Glucose: 91 mg/dL (ref 70–99)
Potassium: 4.1 mmol/L (ref 3.5–5.2)
Sodium: 143 mmol/L (ref 134–144)
Total Protein: 6.8 g/dL (ref 6.0–8.5)
eGFR: 86 mL/min/1.73 (ref >59)

## 2024-06-30 LAB — TSH: TSH: 1.86 u[IU]/mL (ref 0.450–4.500)

## 2024-09-11 ENCOUNTER — Other Ambulatory Visit: Payer: Self-pay | Admitting: Family Medicine

## 2024-09-11 DIAGNOSIS — E039 Hypothyroidism, unspecified: Secondary | ICD-10-CM

## 2024-09-11 DIAGNOSIS — E782 Mixed hyperlipidemia: Secondary | ICD-10-CM

## 2025-06-30 ENCOUNTER — Encounter: Admitting: Family Medicine
